# Patient Record
Sex: Female | Born: 1970 | Race: White | Hispanic: No | Marital: Single | Smoking: Former smoker
Health system: Southern US, Community
[De-identification: ages and names within clinical notes are randomized; demographics above are authoritative.]

## PROBLEM LIST (undated history)

## (undated) DIAGNOSIS — E119 Type 2 diabetes mellitus without complications: Secondary | ICD-10-CM

## (undated) HISTORY — PX: HIP SURGERY: SHX245

## (undated) HISTORY — PX: NECK SURGERY: SHX720

---

## 2019-06-28 ENCOUNTER — Other Ambulatory Visit: Payer: Self-pay

## 2019-06-28 ENCOUNTER — Encounter: Payer: Self-pay | Admitting: Family Medicine

## 2019-06-28 ENCOUNTER — Ambulatory Visit: Payer: Self-pay | Attending: Family Medicine | Admitting: Family Medicine

## 2019-06-28 VITALS — BP 110/75 | HR 85 | Temp 97.7°F | Resp 18 | Ht 66.0 in | Wt 224.0 lb

## 2019-06-28 DIAGNOSIS — M79676 Pain in unspecified toe(s): Secondary | ICD-10-CM

## 2019-06-28 DIAGNOSIS — IMO0001 Reserved for inherently not codable concepts without codable children: Secondary | ICD-10-CM

## 2019-06-28 DIAGNOSIS — R638 Other symptoms and signs concerning food and fluid intake: Secondary | ICD-10-CM

## 2019-06-28 DIAGNOSIS — E119 Type 2 diabetes mellitus without complications: Secondary | ICD-10-CM

## 2019-06-28 LAB — POCT GLYCOSYLATED HEMOGLOBIN (HGB A1C): Hemoglobin A1C: 6.7 % — AB (ref 4.0–5.6)

## 2019-06-28 MED ORDER — TRUE METRIX BLOOD GLUCOSE TEST VI STRP: ORAL_STRIP | 3 refills | Status: DC

## 2019-06-28 MED ORDER — AMOXICILLIN 500 MG PO CAPS: 500.0000 mg | ORAL_CAPSULE | Freq: Two times a day (BID) | ORAL | 0 refills | Status: DC

## 2019-06-28 MED ORDER — TRUEPLUS LANCETS 28G MISC: 3 refills | Status: DC

## 2019-06-28 MED ORDER — METFORMIN HCL 500 MG PO TABS: 500.0000 mg | ORAL_TABLET | Freq: Two times a day (BID) | ORAL | 4 refills | Status: DC

## 2019-06-28 MED ORDER — TRUE METRIX METER W/DEVICE KIT: PACK | 0 refills | Status: DC

## 2019-06-28 MED FILL — AMOXICILLIN 500 MG CAPSULE: 500 | 10 days supply | Qty: 20 | Fill #0

## 2019-06-28 NOTE — Progress Notes (Signed)
Subjective:  Patient ID: Monica Mcfarland, female    DOB: 24-Dec-1970  Age: 49 y.o. MRN: [id number]  CC: Establish Care   HPI Monica Mcfarland, 49 year old female who presents to establish care.  She denies any significant past medical issues.  She reports that her main concern at today's visit is that she is having pain in both great toenails right greater than left.  She did start exercising about 3 months ago around the time of the onset of the pain in her toenails.  She also started wearing new shoes which were given to her by someone.  She now states that the pain in her great toenails is so severe at times that she cannot wear shoes because the pressure of the shoe against her nails causes increased pain.  Sometimes nails have no pain but always have pain if something is pressed against the nail.  She has noticed that the nail has been thicker and wider and she has tried trimming away the parts of the nail that appeared discolored.  She has noticed no blood or discolored drainage from the nails but the edges of the toe around the toenails have been red and swollen.  She denies any injury to the toenails.  She is concerned that she may have a fungal infection in her toenails and that she might be diabetic.         She reports that she has a strong family history of diabetes in both parents as well as 2 sisters with diagnosis of diabetes and 1 sister is only 2 years older than the patient.  Over the past 2 months, she feels as if she is constantly thirsty.  She has been trying to make changes in her diet to lose weight.  Because she is constantly thirsty she is drinking more water and therefore urinating more frequently.  She denies any abdominal pain-no nausea/vomiting/diarrhea or constipation.  No discomfort with urination.  She denies any increased back pain.  No numbness or tingling in the hands or feet.  History reviewed. No pertinent past medical history.  Past Surgical History:  Procedure  Laterality Date  . CESAREAN SECTION     3 children     Family History  Problem Relation Age of Onset  . Diabetes Mother   . Heart disease Mother   . Hearing loss Maternal Grandmother   . Hearing loss Maternal Grandfather     Social History   Tobacco Use  . Smoking status: Not on file  . Smokeless tobacco: Never Used  Substance Use Topics  . Alcohol use: Not Currently    ROS Review of Systems  Constitutional: Positive for fatigue. Negative for chills and fever.  HENT: Negative for sore throat and trouble swallowing.   Eyes: Negative for photophobia and visual disturbance.  Respiratory: Negative for cough and shortness of breath.   Cardiovascular: Negative for chest pain and palpitations.  Gastrointestinal: Negative for abdominal pain, blood in stool, constipation and diarrhea.  Endocrine: Positive for polydipsia and polyuria. Negative for polyphagia.  Genitourinary: Positive for frequency. Negative for dysuria and flank pain.  Musculoskeletal: Positive for gait problem. Negative for back pain.       Pain in bilateral great toenails  Skin: Positive for color change (Color change of toenails). Negative for rash and wound.  Neurological: Negative for dizziness and headaches.  Hematological: Negative for adenopathy. Does not bruise/bleed easily.  Psychiatric/Behavioral: Negative for self-injury and suicidal ideas. The patient is nervous/anxious (Anxious that she may be  diabetic).     Objective:   Today's Vitals: BP 110/75 (BP Location: Left Arm, Patient Position: Sitting, Cuff Size: Normal)   Pulse 85   Temp 97.7 F (36.5 C) (Oral)   Resp 18   Ht 5\' 6"  (1.676 m)   Wt 224 lb (101.6 kg)   SpO2 97%   BMI 36.15 kg/m   Physical Exam Vitals reviewed. Nursing note reviewed: not available.  Constitutional:      General: She is not in acute distress.    Appearance: Normal appearance. She is obese.  Cardiovascular:     Rate and Rhythm: Normal rate and regular rhythm.    Pulmonary:     Effort: Pulmonary effort is normal.     Breath sounds: Normal breath sounds.  Abdominal:     Palpations: Abdomen is soft.     Tenderness: There is no abdominal tenderness. There is no right CVA tenderness, left CVA tenderness, guarding or rebound.  Musculoskeletal:        General: No swelling or tenderness.     Right lower leg: No edema.     Left lower leg: No edema.  Skin:    General: Skin is warm and dry.     Comments: Thickening of the ends of the great toenails with whitish discoloration on top and bottom of nail appears black but nails are trimmed in a curved fashion with ingrowth of both nails with erythema and edema of the surrounding tissue.  No visible pus like material/drainage but area is very tender to touch right greater than left  Neurological:     General: No focal deficit present.     Mental Status: She is alert and oriented to person, place, and time.  Psychiatric:        Mood and Affect: Mood normal.        Behavior: Behavior normal.     Assessment & Plan:  1. Pain around toenail She has complaint of pain that has been going on for about 3 months that has gradually worsened and now has pain when she is wearing shoes.  She appears on examination to have some onychomycosis of the great toenails bilaterally as well as some ingrowing of the sides of the nails.  Prescription for amoxicillin 500 mg twice daily x10 days due to possible infection from ingrowing toenails.  Referral placed to podiatry.  Patient is encouraged to apply for [facility] financial discount program to help with the cost of medical care/specialty follow-up. - Ambulatory referral to Podiatry - amoxicillin (AMOXIL) 500 MG capsule; Take 1 capsule (500 mg total) by mouth 2 (two) times daily.  Dispense: 20 capsule; Refill: 0  2. Thirst Patient reports family history of mom, dad and 2 sisters with diabetes and she would like to have testing for diabetes as well.  She reports for the past 2 to 3  months she has had increased thirst which has caused her to have increased daily water intake resulting in frequent urination.  Will check hemoglobin A1c. - HgB A1c  3. New onset type 2 diabetes mellitus (HCC) Patient with hemoglobin A1c of 6.7 consistent with a diagnosis of diabetes.  Patient unfortunately was seen for the last appointment time of the day and lab was closed by the end of patient's visit and pharmacy had been called ahead regarding her antibiotic so that she could pick up this medication before pharmacy closed.  Patient has been asked to return in 1 to 2 weeks to follow-up with myself or the  clinical pharmacist regarding new onset of type 2 diabetes.  Patient additionally will be contacted by phone and asked to pick up a prescription from this pharmacy for Metformin that she can start to help with insulin resistance and will also be prescribed diabetic testing supplies. -Phone number listed for the patient was called and message was left for patient regarding availability of a new prescription at [facility] that she may pick up tomorrow  An After Visit Summary was printed and given to the patient.   Follow-up: Return in about 2 weeks (around 07/12/2019) for 1-2 week f/u with myself/Dr. Chapman Fitch if possible.    Antony Blackbird MD

## 2019-06-29 MED FILL — TRUE METRIX TEST STRIP: 90 days supply | Qty: 100 | Fill #0

## 2019-06-29 MED FILL — TRUEplus LANCETS 28G MISC: 90 days supply | Qty: 100 | Fill #0

## 2019-06-29 MED FILL — metFORMIN HCL 500 MG TABS: 500 | 30 days supply | Qty: 60 | Fill #0

## 2019-06-29 MED FILL — !TRUE METRIX BLOOD GLUCOSE: 30 days supply | Qty: 1 | Fill #0

## 2019-07-12 ENCOUNTER — Other Ambulatory Visit: Payer: Self-pay

## 2019-07-12 ENCOUNTER — Ambulatory Visit: Payer: Self-pay | Attending: Family Medicine | Admitting: Family Medicine

## 2019-07-12 ENCOUNTER — Encounter: Payer: Self-pay | Admitting: Family Medicine

## 2019-07-12 VITALS — BP 116/79 | HR 84 | Temp 97.2°F | Resp 16 | Wt 222.4 lb

## 2019-07-12 DIAGNOSIS — M79676 Pain in unspecified toe(s): Secondary | ICD-10-CM

## 2019-07-12 DIAGNOSIS — E119 Type 2 diabetes mellitus without complications: Secondary | ICD-10-CM

## 2019-07-12 LAB — GLUCOSE, POCT (MANUAL RESULT ENTRY): POC Glucose: 173 mg/dL — AB (ref 70–99)

## 2019-07-12 MED ORDER — TRUE METRIX BLOOD GLUCOSE TEST VI STRP: ORAL_STRIP | 3 refills | Status: AC

## 2019-07-12 MED ORDER — TRUE METRIX METER W/DEVICE KIT: PACK | 0 refills | Status: AC

## 2019-07-12 MED ORDER — IBUPROFEN 600 MG PO TABS: 600.0000 mg | ORAL_TABLET | Freq: Three times a day (TID) | ORAL | 0 refills | Status: DC | PRN

## 2019-07-12 MED ORDER — METFORMIN HCL 500 MG PO TABS: 500.0000 mg | ORAL_TABLET | Freq: Two times a day (BID) | ORAL | 4 refills | Status: DC

## 2019-07-12 MED ORDER — TRUEPLUS LANCETS 28G MISC: 3 refills | Status: AC

## 2019-07-12 NOTE — Progress Notes (Signed)
Subjective:  Patient ID: Monica Mcfarland, female    DOB: 19-Feb-1971  Age: 49 y.o. MRN: [id number]  CC: No chief complaint on file. Follow-up with painful toenails and abnormal hemoglobin A1c at last visit-    HPI Monica Mcfarland, 49 year old female, who is status post recent visit to establish care and patient at that time with complaint of bilateral toenail pain and patient with concern that she might be diabetic due to a strong family history of diabetes as well as increased thirst, frequent urination and possible fungal nail infection.  At her last visit, she denied any past medical issues but today she states that she continues to have bilateral toenail pain however she is not allowed to have narcotic pain medication as she is currently in a drug and alcohol treatment program.  She does continue to have bilateral toenail pain especially when there is any pressure against her nails such as wearing socks or shoes.  She had mild improvement in nail discomfort after antibiotic therapy at her last visit.  She reports that she was not aware of her lab results from her last visit and continues to have increased thirst and urinary frequency.  She is not surprised that lab work revealing a diagnosis of diabetes due to her strong family history as well as her current symptoms.  Her current toenail pain is about a 7-8 on a 0-to-10 scale whenever she is wearing shoes/walking.    Past Surgical History:  Procedure Laterality Date  . CESAREAN SECTION     3 children     Family History  Problem Relation Age of Onset  . Diabetes Mother   . Heart disease Mother   . Diabetes Father   . Hearing loss Maternal Grandmother   . Hearing loss Maternal Grandfather   . Diabetes Sister     Social History   Tobacco Use  . Smoking status: Former Smoker    Types: Cigarettes    Start date: 07/12/2015  . Smokeless tobacco: Never Used  Substance Use Topics  . Alcohol use: Not Currently    ROS Review of  Systems  Constitutional: Positive for fatigue. Negative for chills and fever.  HENT: Negative for sore throat and trouble swallowing.   Eyes: Negative for photophobia and visual disturbance.  Respiratory: Negative for cough and shortness of breath.   Cardiovascular: Negative for chest pain and palpitations.  Gastrointestinal: Negative for abdominal pain, blood in stool, constipation, diarrhea and nausea.  Endocrine: Positive for polydipsia, polyphagia and polyuria.  Genitourinary: Positive for frequency. Negative for dysuria.  Musculoskeletal: Positive for gait problem. Negative for arthralgias and back pain.  Skin: Negative for rash and wound.  Neurological: Negative for dizziness and headaches.  Hematological: Negative for adenopathy. Does not bruise/bleed easily.  Psychiatric/Behavioral: Negative for self-injury and suicidal ideas.    Objective:   Today's Vitals: BP 116/79 (BP Location: Left Arm, Patient Position: Sitting, Cuff Size: Large)   Pulse 84   Temp (!) 97.2 F (36.2 C)   Resp 16   Wt 222 lb 6.4 oz (100.9 kg)   SpO2 97%   BMI 35.90 kg/m   Physical Exam Vitals and nursing note reviewed.  Constitutional:      General: She is not in acute distress.    Appearance: Normal appearance. She is obese. She is not ill-appearing.  Cardiovascular:     Rate and Rhythm: Normal rate and regular rhythm.  Pulmonary:     Effort: Pulmonary effort is normal.     Breath  sounds: Normal breath sounds.  Abdominal:     Palpations: Abdomen is soft.     Tenderness: There is no abdominal tenderness. There is no right CVA tenderness, left CVA tenderness, guarding or rebound.  Musculoskeletal:        General: No tenderness.     Right lower leg: No edema.     Left lower leg: No edema.  Skin:    Comments: Patient with ingrowing toenails, great toenails are misshapened with some areas of discoloration.  Tenderness and mild edema/erythema at the toenail borders which is decreased from last  visit.  No visible pus/signs of infection and no increased warmth  Neurological:     General: No focal deficit present.     Mental Status: She is alert.  Psychiatric:        Mood and Affect: Mood normal.        Behavior: Behavior normal.     Assessment & Plan:   1. New onset type 2 diabetes mellitus (HCC)  Postprandial glucose of 173 and the patient's last visit, her hemoglobin A1c was 6.7 consistent with prediabetes.  Patient reports that she did not receive notification regarding her hemoglobin A1c or that prescriptions have been sent to [facility] for Metformin and diabetic testing supplies.  Patient would like prescription sent to [facility] which was done at today's visit.  Discussed low carbohydrate diet, no concentrated sweets.  Patient will have comprehensive metabolic panel at today's visit in follow-up of newly diabetes. - Glucose (CBG)  -Comprehensive metabolic panel  2.  Pain around toenails Prescription provided for ibuprofen to help with nail pain while podiatry referral is pending.  Outpatient Encounter Medications as of 07/12/2019  Medication Sig  . amoxicillin (AMOXIL) 500 MG capsule Take 1 capsule (500 mg total) by mouth 2 (two) times daily.  . Blood Glucose Monitoring Suppl (TRUE METRIX METER) w/Device KIT Use to check blood sugars once per day either fasting/2 hours after a meal or before bedtime  . glucose blood (TRUE METRIX BLOOD GLUCOSE TEST) test strip Use as instructed to check blood sugar once per day  . metFORMIN (GLUCOPHAGE) 500 MG tablet Take 1 tablet (500 mg total) by mouth 2 (two) times daily with a meal. Start with one daily after evening meal for one week then increase to twice per day (Patient not taking: Reported on 07/12/2019)  . TRUEplus Lancets 28G MISC Use once daily when checking blood sugars   No facility-administered encounter medications on file as of 07/12/2019.    An After Visit Summary was printed and given to the  patient.   Follow-up: Return in about 3 months (around 10/12/2019) for chronic issues; 2-3 week f/u with [facility] regarding DM.   Antony Blackbird MD

## 2019-07-12 NOTE — Patient Instructions (Addendum)
Diabetes Basics  Diabetes (diabetes mellitus) is a long-term (chronic) disease. It occurs when the body does not properly use sugar (glucose) that is released from food after you eat. Diabetes may be caused by one or both of these problems:  Your pancreas does not make enough of a hormone called insulin.  Your body does not react in a normal way to insulin that it makes. Insulin lets sugars (glucose) go into cells in your body. This gives you energy. If you have diabetes, sugars cannot get into cells. This causes high blood sugar (hyperglycemia). Follow these instructions at home: How is diabetes treated? You may need to take insulin or other diabetes medicines daily to keep your blood sugar in balance. Take your diabetes medicines every day as told by your doctor. List your diabetes medicines here: Diabetes medicines  Name of medicine: ______________________________ ? Amount (dose): _______________ Time (a.m./p.m.): _______________ Notes: ___________________________________  Name of medicine: ______________________________ ? Amount (dose): _______________ Time (a.m./p.m.): _______________ Notes: ___________________________________  Name of medicine: ______________________________ ? Amount (dose): _______________ Time (a.m./p.m.): _______________ Notes: ___________________________________ If you use insulin, you will learn how to give yourself insulin by injection. You may need to adjust the amount based on the food that you eat. List the types of insulin you use here: Insulin  Insulin type: ______________________________ ? Amount (dose): _______________ Time (a.m./p.m.): _______________ Notes: ___________________________________  Insulin type: ______________________________ ? Amount (dose): _______________ Time (a.m./p.m.): _______________ Notes: ___________________________________  Insulin type: ______________________________ ? Amount (dose): _______________ Time (a.m./p.m.):  _______________ Notes: ___________________________________  Insulin type: ______________________________ ? Amount (dose): _______________ Time (a.m./p.m.): _______________ Notes: ___________________________________  Insulin type: ______________________________ ? Amount (dose): _______________ Time (a.m./p.m.): _______________ Notes: ___________________________________ How do I manage my blood sugar?  Check your blood sugar levels using a blood glucose monitor as directed by your doctor. Your doctor will set treatment goals for you. Generally, you should have these blood sugar levels:  Before meals (preprandial): 80-130 mg/dL (4.4-7.2 mmol/L).  After meals (postprandial): below 180 mg/dL (10 mmol/L).  A1c level: less than 7%. Write down the times that you will check your blood sugar levels: Blood sugar checks  Time: _______________ Notes: ___________________________________  Time: _______________ Notes: ___________________________________  Time: _______________ Notes: ___________________________________  Time: _______________ Notes: ___________________________________  Time: _______________ Notes: ___________________________________  Time: _______________ Notes: ___________________________________  What do I need to know about low blood sugar? Low blood sugar is called hypoglycemia. This is when blood sugar is at or below 70 mg/dL (3.9 mmol/L). Symptoms may include:  Feeling: ? Hungry. ? Worried or nervous (anxious). ? Sweaty and clammy. ? Confused. ? Dizzy. ? Sleepy. ? Sick to your stomach (nauseous).  Having: ? A fast heartbeat. ? A headache. ? A change in your vision. ? Tingling or no feeling (numbness) around the mouth, lips, or tongue. ? Jerky movements that you cannot control (seizure).  Having trouble with: ? Moving (coordination). ? Sleeping. ? Passing out (fainting). ? Getting upset easily (irritability). Treating low blood sugar To treat low blood  sugar, eat or drink something sugary right away. If you can think clearly and swallow safely, follow the 15:15 rule:  Take 15 grams of a fast-acting carb (carbohydrate). Talk with your doctor about how much you should take.  Some fast-acting carbs are: ? Sugar tablets (glucose pills). Take 3-4 glucose pills. ? 6-8 pieces of hard candy. ? 4-6 oz (120-150 mL) of fruit juice. ? 4-6 oz (120-150 mL) of regular (not diet) soda. ? 1 Tbsp (15 mL) honey or sugar.    Check your blood sugar 15 minutes after you take the carb.  If your blood sugar is still at or below 70 mg/dL (3.9 mmol/L), take 15 grams of a carb again.  If your blood sugar does not go above 70 mg/dL (3.9 mmol/L) after 3 tries, get help right away.  After your blood sugar goes back to normal, eat a meal or a snack within 1 hour. Treating very low blood sugar If your blood sugar is at or below 54 mg/dL (3 mmol/L), you have very low blood sugar (severe hypoglycemia). This is an emergency. Do not wait to see if the symptoms will go away. Get medical help right away. Call your local emergency services (911 in the [place] Do not drive yourself to the hospital. Questions to ask your health care provider  Do I need to meet with a diabetes educator?  What equipment will I need to care for myself at home?  What diabetes medicines do I need? When should I take them?  How often do I need to check my blood sugar?  What number can I call if I have questions?  When is my next doctor's visit?  Where can I find a support group for people with diabetes? Where to find more information  American Diabetes Association: www.diabetes.org  American Association of Diabetes Educators: www.diabeteseducator.org/patient-resources Contact a doctor if:  Your blood sugar is at or above 240 mg/dL (13.3 mmol/L) for 2 days in a row.  You have been sick or have had a fever for 2 days or more, and you are not getting better.  You have any of these  problems for more than 6 hours: ? You cannot eat or drink. ? You feel sick to your stomach (nauseous). ? You throw up (vomit). ? You have watery poop (diarrhea). Get help right away if:  Your blood sugar is lower than 54 mg/dL (3 mmol/L).  You get confused.  You have trouble: ? Thinking clearly. ? Breathing. Summary  Diabetes (diabetes mellitus) is a long-term (chronic) disease. It occurs when the body does not properly use sugar (glucose) that is released from food after digestion.  Take insulin and diabetes medicines as told.  Check your blood sugar every day, as often as told.  Keep all follow-up visits as told by your doctor. This is important. This information is not intended to replace advice given to you by your health care provider. Make sure you discuss any questions you have with your health care provider. Document Revised: 01/10/2019 Document Reviewed: 07/22/2017 Elsevier Patient Education  2020 [employer]  Carbohydrate Counting for Diabetes Mellitus, Adult  Carbohydrate counting is a method of keeping track of how many carbohydrates you eat. Eating carbohydrates naturally increases the amount of sugar (glucose) in the blood. Counting how many carbohydrates you eat helps keep your blood glucose within normal limits, which helps you manage your diabetes (diabetes mellitus). It is important to know how many carbohydrates you can safely have in each meal. This is different for every person. A diet and nutrition specialist (registered dietitian) can help you make a meal plan and calculate how many carbohydrates you should have at each meal and snack. Carbohydrates are found in the following foods:  Grains, such as breads and cereals.  Dried beans and soy products.  Starchy vegetables, such as potatoes, peas, and corn.  Fruit and fruit juices.  Milk and yogurt.  Sweets and snack foods, such as cake, cookies, candy, chips, and soft drinks. How do I   count  carbohydrates? There are two ways to count carbohydrates in food. You can use either of the methods or a combination of both. Reading "Nutrition Facts" on packaged food The "Nutrition Facts" list is included on the labels of almost all packaged foods and beverages in the [place] It includes:  The serving size.  Information about nutrients in each serving, including the grams (g) of carbohydrate per serving. To use the "Nutrition Facts":  Decide how many servings you will have.  Multiply the number of servings by the number of carbohydrates per serving.  The resulting number is the total amount of carbohydrates that you will be having. Learning standard serving sizes of other foods When you eat carbohydrate foods that are not packaged or do not include "Nutrition Facts" on the label, you need to measure the servings in order to count the amount of carbohydrates:  Measure the foods that you will eat with a food scale or measuring cup, if needed.  Decide how many standard-size servings you will eat.  Multiply the number of servings by 15. Most carbohydrate-rich foods have about 15 g of carbohydrates per serving. ? For example, if you eat 8 oz (170 g) of strawberries, you will have eaten 2 servings and 30 g of carbohydrates (2 servings x 15 g = 30 g).  For foods that have more than one food mixed, such as soups and casseroles, you must count the carbohydrates in each food that is included. The following list contains standard serving sizes of common carbohydrate-rich foods. Each of these servings has about 15 g of carbohydrates:   hamburger bun or  English muffin.   oz (15 mL) syrup.   oz (14 g) jelly.  1 slice of bread.  1 six-inch tortilla.  3 oz (85 g) cooked rice or pasta.  4 oz (113 g) cooked dried beans.  4 oz (113 g) starchy vegetable, such as peas, corn, or potatoes.  4 oz (113 g) hot cereal.  4 oz (113 g) mashed potatoes or  of a large baked potato.  4 oz (113  g) canned or frozen fruit.  4 oz (120 mL) fruit juice.  4-6 crackers.  6 chicken nuggets.  6 oz (170 g) unsweetened dry cereal.  6 oz (170 g) plain fat-free yogurt or yogurt sweetened with artificial sweeteners.  8 oz (240 mL) milk.  8 oz (170 g) fresh fruit or one small piece of fruit.  24 oz (680 g) popped popcorn. Example of carbohydrate counting Sample meal  3 oz (85 g) chicken breast.  6 oz (170 g) brown rice.  4 oz (113 g) corn.  8 oz (240 mL) milk.  8 oz (170 g) strawberries with sugar-free whipped topping. Carbohydrate calculation 1. Identify the foods that contain carbohydrates: ? Rice. ? Corn. ? Milk. ? Strawberries. 2. Calculate how many servings you have of each food: ? 2 servings rice. ? 1 serving corn. ? 1 serving milk. ? 1 serving strawberries. 3. Multiply each number of servings by 15 g: ? 2 servings rice x 15 g = 30 g. ? 1 serving corn x 15 g = 15 g. ? 1 serving milk x 15 g = 15 g. ? 1 serving strawberries x 15 g = 15 g. 4. Add together all of the amounts to find the total grams of carbohydrates eaten: ? 30 g + 15 g + 15 g + 15 g = 75 g of carbohydrates total. Summary  Carbohydrate counting is a method of   keeping track of how many carbohydrates you eat.  Eating carbohydrates naturally increases the amount of sugar (glucose) in the blood.  Counting how many carbohydrates you eat helps keep your blood glucose within normal limits, which helps you manage your diabetes.  A diet and nutrition specialist (registered dietitian) can help you make a meal plan and calculate how many carbohydrates you should have at each meal and snack. This information is not intended to replace advice given to you by your health care provider. Make sure you discuss any questions you have with your health care provider. Document Revised: 11/11/2016 Document Reviewed: 10/01/2015 Elsevier Patient Education  2020 Elsevier Inc.  

## 2019-07-12 NOTE — Progress Notes (Signed)
States she was not aware of last A1C number. Has not taken medication, Metformin  Bilateral foot pain 7/10.

## 2019-07-13 LAB — COMPREHENSIVE METABOLIC PANEL WITH GFR
ALT: 15 IU/L (ref 0–32)
AST: 16 IU/L (ref 0–40)
Albumin/Globulin Ratio: 1.6 (ref 1.2–2.2)
Albumin: 4.6 g/dL (ref 3.8–4.8)
Alkaline Phosphatase: 113 IU/L (ref 39–117)
BUN/Creatinine Ratio: 18 (ref 9–23)
BUN: 14 mg/dL (ref 6–24)
Bilirubin Total: 0.5 mg/dL (ref 0.0–1.2)
CO2: 25 mmol/L (ref 20–29)
Calcium: 9.5 mg/dL (ref 8.7–10.2)
Chloride: 104 mmol/L (ref 96–106)
Creatinine, Ser: 0.79 mg/dL (ref 0.57–1.00)
GFR calc Af Amer: 102 mL/min/1.73
GFR calc non Af Amer: 88 mL/min/1.73
Globulin, Total: 2.9 g/dL (ref 1.5–4.5)
Glucose: 162 mg/dL — ABNORMAL HIGH (ref 65–99)
Potassium: 4 mmol/L (ref 3.5–5.2)
Sodium: 144 mmol/L (ref 134–144)
Total Protein: 7.5 g/dL (ref 6.0–8.5)

## 2019-07-15 DIAGNOSIS — E119 Type 2 diabetes mellitus without complications: Secondary | ICD-10-CM | POA: Insufficient documentation

## 2019-07-15 DIAGNOSIS — E669 Obesity, unspecified: Secondary | ICD-10-CM | POA: Insufficient documentation

## 2019-07-15 DIAGNOSIS — E1169 Type 2 diabetes mellitus with other specified complication: Secondary | ICD-10-CM | POA: Insufficient documentation

## 2019-08-06 ENCOUNTER — Other Ambulatory Visit: Payer: Self-pay

## 2019-08-06 ENCOUNTER — Ambulatory Visit: Payer: Self-pay | Attending: Family Medicine | Admitting: Pharmacist

## 2019-08-06 DIAGNOSIS — E119 Type 2 diabetes mellitus without complications: Secondary | ICD-10-CM

## 2019-08-06 MED ORDER — METFORMIN HCL ER 500 MG PO TB24: ORAL_TABLET | ORAL | 2 refills | Status: DC

## 2019-08-06 MED ORDER — ROSUVASTATIN CALCIUM 10 MG PO TABS: 10.0000 mg | ORAL_TABLET | Freq: Every day | ORAL | 0 refills | Status: DC

## 2019-08-06 NOTE — Progress Notes (Signed)
    S:    PCP: Dr. Jillyn Hidden  No chief complaint on file.  Patient arrives in good spirits. Presents for diabetes evaluation, education, and management Patient was referred and last seen by Primary Care Provider on 07/12/2019.   Patient reports Diabetes was diagnosed in March 2021.  Family/Social History:  - FHx: DM, heart disease  - Tobacco: former smoker - Alcohol: denies use   Insurance coverage/medication affordability: self pay  Patient reports adherence with medications.  Current diabetes medications include: metformin 500 mg BID Current hypertension medications include: none  Current hyperlipidemia medications include: none   Patient denies hypoglycemic events.  Patient reported dietary habits: - Dietary counseling given   Patient-reported exercise habits: limited; p lans to increase. "I'm working on it"   Patient reports nocturia (nighttime urination).  Patient denies neuropathy (nerve pain). Patient denies visual changes. Patient reports self foot exams.     O:   Lab Results  Component Value Date   HGBA1C 6.7 (A) 06/28/2019   There were no vitals filed for this visit.  Lipid Panel  No results found for: CHOL, TRIG, HDL, CHOLHDL, VLDL, LDLCALC, LDLDIRECT  Home fasting blood sugars:  Not checking currently  2 hour post-meal/random blood sugars: 85 - 140.   Clinical Atherosclerotic Cardiovascular Disease (ASCVD): No  The ASCVD Risk score Denman George DC Jr., et al., 2013) failed to calculate for the following reasons:   Cannot find a previous HDL lab   Cannot find a previous total cholesterol lab   A/P: Diabetes newly dx currently controlled. Patient is able to verbalize appropriate hypoglycemia management plan. Patient is adherent with medication but endorses diarrhea since starting metformin. Will change to XR. Home CBGs appear well-controlled.  -Discontinued metformin. -Start metformin 500 mg XR; take 1 tablet BID for 1 week. If tolerable after 1 week, increase  to two tablets BID.  -Extensively discussed pathophysiology of diabetes, recommended lifestyle interventions, dietary effects on blood sugar control -Counseled on s/sx of and management of hypoglycemia -Next A1C anticipated 12/2019.   ASCVD risk - primary prevention in patient with diabetes. Need lipid panel to accurately estimate ASCVD risk score. I recommend at least moderate intensity statin indicated. -Started rosuvastatin 10 mg.  -Lipid  HM: Pneumovax recommended. Will address at follow-up.   Written patient instructions provided.  Total time in face to face counseling 15 minutes.   Follow up Pharmacist Clinic Visit in 1 month.   Butch Penny, PharmD, CPP Clinical Pharmacist [facility] [phone]

## 2019-08-06 NOTE — Patient Instructions (Signed)
Thank you for coming to see me today. Please do the following:  1. Stop metformin.  2. Start extended release metformin. Take 1 tablet twice a day with a meal. After 1 week, increase to 2 tablets twice a day with a meal if tolerable.  3. Start taking rosuvastatin 10mg . Take 1 tablet in the evening.  4. Continue checking blood sugars at home. Check fasting and after meals.  5. Continue making the lifestyle changes we've discussed together during our visit. Diet and exercise play a significant role in improving your blood sugars.  6. Follow-up with me in 1 month.    Hypoglycemia or low blood sugar:   Low blood sugar can happen quickly and may become an emergency if not treated right away.   While this shouldn't happen often, it can be brought upon if you skip a meal or do not eat enough. Also, if your insulin or other diabetes medications are dosed too high, this can cause your blood sugar to go to low.   Warning signs of low blood sugar include: 1. Feeling shaky or dizzy 2. Feeling weak or tired  3. Excessive hunger 4. Feeling anxious or upset  5. Sweating even when you aren't exercising  What to do if I experience low blood sugar? 1. Check your blood sugar with your meter. If lower than 70, proceed to step 2.  2. Treat with 3-4 glucose tablets or 3 packets of regular sugar. If these aren't around, you can try hard candy. Yet another option would be to drink 4 ounces of fruit juice or 6 ounces of REGULAR soda.  3. Re-check your sugar in 15 minutes. If it is still below 70, do what you did in step 2 again. If has come back up, go ahead and eat a snack or small meal at this time.

## 2019-08-07 LAB — LIPID PANEL
Chol/HDL Ratio: 2.4 ratio (ref 0.0–4.4)
Cholesterol, Total: 127 mg/dL (ref 100–199)
HDL: 54 mg/dL
LDL Chol Calc (NIH): 59 mg/dL (ref 0–99)
Triglycerides: 71 mg/dL (ref 0–149)
VLDL Cholesterol Cal: 14 mg/dL (ref 5–40)

## 2019-08-08 ENCOUNTER — Telehealth: Payer: Self-pay | Admitting: *Deleted

## 2019-08-08 NOTE — Telephone Encounter (Signed)
Metformin very commonly causes diarrhea when a patient first starts taking it. It typically resolves within the first week or so. I would recommend she take OTC Imodium if needed while her body adjusts to the medication.   Marcy Siren, D.O. Primary Care at [facility]  08/08/2019, 4:02 PM

## 2019-08-08 NOTE — Telephone Encounter (Signed)
Informed patient with what provider stated and she verbalized understanding.  

## 2019-08-08 NOTE — Telephone Encounter (Signed)
Spoke with patient and she thinks the Metformin is causing her to get diarrhea. Per pt she is wanting to know if she could get something to help her with her diarrhea.

## 2019-09-05 ENCOUNTER — Ambulatory Visit: Payer: Self-pay | Attending: Family Medicine | Admitting: Pharmacist

## 2019-09-05 ENCOUNTER — Encounter: Payer: Self-pay | Admitting: Pharmacist

## 2019-09-05 ENCOUNTER — Other Ambulatory Visit: Payer: Self-pay

## 2019-09-05 DIAGNOSIS — Z23 Encounter for immunization: Secondary | ICD-10-CM

## 2019-09-05 DIAGNOSIS — E119 Type 2 diabetes mellitus without complications: Secondary | ICD-10-CM

## 2019-09-05 NOTE — Progress Notes (Signed)
    S:    PCP: Dr. Jillyn Hidden  No chief complaint on file.  Patient arrives in good spirits. Presents for diabetes evaluation, education, and management Patient was referred and last seen by Primary Care Provider on 07/12/2019.   Patient reports Diabetes was diagnosed in March 2021.  Family/Social History:  - FHx: DM, heart disease  - Tobacco: former smoker - Alcohol: denies use   Insurance coverage/medication affordability: self pay  Patient reports adherence with medications.  Current diabetes medications include: metformin 500 mg BID Current hypertension medications include: none  Current hyperlipidemia medications include: none   Patient denies hypoglycemic events.  Patient reported dietary habits: - Dietary counseling given   Patient-reported exercise habits: limited; p lans to increase. "I'm working on it"   Patient reports nocturia (nighttime urination).  Patient denies neuropathy (nerve pain). Patient denies visual changes. Patient reports self foot exams.     O:   Lab Results  Component Value Date   HGBA1C 6.7 (A) 06/28/2019   There were no vitals filed for this visit.  Lipid Panel     Component Value Date/Time   CHOL 127 08/06/2019 0915   TRIG 71 08/06/2019 0915   HDL 54 08/06/2019 0915   CHOLHDL 2.4 08/06/2019 0915   LDLCALC 59 08/06/2019 0915    Home fasting blood sugars:  Not checking currently  2 hour post-meal/random blood sugars: 85 - 140.   Clinical Atherosclerotic Cardiovascular Disease (ASCVD): No  The ASCVD Risk score Denman George DC Jr., et al., 2013) failed to calculate for the following reasons:   The valid total cholesterol range is 130 to 320 mg/dL   A/P: Diabetes newly dx currently controlled. Patient is able to verbalize appropriate hypoglycemia management plan. Patient is adherent with medication and reports resolution of diarrhea since startin XR form of metformin.   -Continue current regimen.  -Extensively discussed pathophysiology of  diabetes, recommended lifestyle interventions, dietary effects on blood sugar control -Counseled on s/sx of and management of hypoglycemia -Next A1C anticipated 12/2019.   ASCVD risk - primary prevention in patient with diabetes. LDL controlled at 59. Pt is tolerating rosuvastatin.  -Continue rosuvastatin 10 mg daily.   HM: Pneumovax given.  Written patient instructions provided.  Total time in face to face counseling 15 minutes.   Follow up Pharmacist Clinic Visit in 1 month.   Butch Penny, PharmD, CPP Clinical Pharmacist [facility] [phone]

## 2019-10-12 ENCOUNTER — Ambulatory Visit: Payer: Self-pay | Admitting: Family Medicine

## 2019-10-31 ENCOUNTER — Other Ambulatory Visit: Payer: Self-pay | Admitting: Family Medicine

## 2019-10-31 DIAGNOSIS — E119 Type 2 diabetes mellitus without complications: Secondary | ICD-10-CM

## 2019-10-31 NOTE — Progress Notes (Signed)
Patient ID: Monica Mcfarland, female    DOB: 03/31/1971  MRN: [id number]  CC: Diabetes Follow-Up  Subjective: Monica Mcfarland is a 49 y.o. female with history of type 2 diabetes mellitus who presents for diabetes follow-up.  1. DIABETES TYPE 2 FOLLOW-UP: Last A1C: 5.9% on 11/02/2019 Are you fasting today: [x]  Yes, egg and cheese wrap, water Have you taken your anti-diabetic medications today: [x]  Yes []  No  Med Adherence:  [x]  Yes    []  No Medication side effects:  []  Yes    [x]  No Home Monitoring?  [x]  Yes    []  No Home glucose results range:   Results for last 7 days morning and evening: 99, 127 103, 88 91, 128 109, 97 101, 109 87,107 91, 112  Diet Adherence: [x]  Yes    []  No Exercise: []  Yes    [x]  No Hypoglycemic episodes?: []  Yes    [x]  No Numbness of the feet? [x]  Yes    []  No Retinopathy hx? []  Yes    [x]  No Last eye exam: 6 months ago, wears prescription glasses, feels like she can see better without them Comments: Last visit last visit 07/12/2019 with Dr. Chapman Fitch.  During that encounter Metformin continued.  CBG and CMP obtained.  2. CHOLESTEROL FOLLOW-UP: Last Lipid Panel results:  HDL  Date Value Ref Range Status  08/06/2019 54 >39 mg/dL Final   Triglycerides  Date Value Ref Range Status  08/06/2019 71 0 - 149 mg/dL Final    Are you fasting today: []  Yes [x]  No Med Adherence: [x]  Yes    []  No Medication side effects: []  Yes    [x]  No Muscle aches:  []  Yes    [x]  No Diet Adherence: []  Yes    [x]  No    Patient Active Problem List   Diagnosis Date Noted  . Type 2 diabetes mellitus ([place]) 07/15/2019     Current Outpatient Medications on File Prior to Visit  Medication Sig Dispense Refill  . amoxicillin (AMOXIL) 500 MG capsule Take 1 capsule (500 mg total) by mouth 2 (two) times daily. 20 capsule 0  . Blood Glucose Monitoring Suppl (TRUE METRIX METER) w/Device KIT Use to check blood sugars once per day either fasting/2 hours after a meal or before  bedtime 1 kit 0  . glucose blood (TRUE METRIX BLOOD GLUCOSE TEST) test strip Use as instructed to check blood sugar once per day 100 each 3  . ibuprofen (ADVIL) 600 MG tablet Take 1 tablet (600 mg total) by mouth every 8 (eight) hours as needed. For pain; eat before taking medication 60 tablet 0  . metFORMIN (GLUCOPHAGE-XR) 500 MG 24 hr tablet TAKE 1 TABLET BY MOUTH TWICE DAILY WITH A MEAL FOR 1 WEEK. INCREASE TO 2 TABLETS 2 TIMES DAILY IF TOLERABLE 180 tablet 1  . rosuvastatin (CRESTOR) 10 MG tablet TAKE 1 TABLET(10 MG) BY MOUTH DAILY 90 tablet 1  . TRUEplus Lancets 28G MISC Use once daily when checking blood sugars 100 each 3   No current facility-administered medications on file prior to visit.    Allergies  Allergen Reactions  . Shellfish Allergy Anaphylaxis    Social History   Socioeconomic History  . Marital status: Single    Spouse name: Not on file  . Number of children: Not on file  . Years of education: Not on file  . Highest education level: Not on file  Occupational History  . Not on file  Tobacco Use  . Smoking status:  Former Smoker    Types: Cigarettes    Start date: 07/12/2015  . Smokeless tobacco: Never Used  Substance and Sexual Activity  . Alcohol use: Not Currently  . Drug use: Not Currently  . Sexual activity: Not Currently  Other Topics Concern  . Not on file  Social History Narrative  . Not on file   Social Determinants of Health   Financial Resource Strain:   . Difficulty of Paying Living Expenses:   Food Insecurity:   . Worried About Charity fundraiser in the Last Year:   . Arboriculturist in the Last Year:   Transportation Needs:   . Film/video editor (Medical):   Marland Kitchen Lack of Transportation (Non-Medical):   Physical Activity:   . Days of Exercise per Week:   . Minutes of Exercise per Session:   Stress:   . Feeling of Stress :   Social Connections:   . Frequency of Communication with Friends and Family:   . Frequency of Social  Gatherings with Friends and Family:   . Attends Religious Services:   . Active Member of Clubs or Organizations:   . Attends Archivist Meetings:   Marland Kitchen Marital Status:   Intimate Partner Violence:   . Fear of Current or Ex-Partner:   . Emotionally Abused:   Marland Kitchen Physically Abused:   . Sexually Abused:     Family History  Problem Relation Age of Onset  . Diabetes Mother   . Heart disease Mother   . Diabetes Father   . Hearing loss Maternal Grandmother   . Hearing loss Maternal Grandfather   . Diabetes Sister     Past Surgical History:  Procedure Laterality Date  . CESAREAN SECTION     3 children     ROS: Review of Systems Negative except as stated above  PHYSICAL EXAM: Vitals with BMI 11/02/2019 07/12/2019 06/28/2019  Height 5' 6"  - 5' 6"   Weight 202 lbs 10 oz 222 lbs 6 oz 224 lbs  BMI 32.72 00.92 33.00  Systolic 98 762 263  Diastolic 65 79 75  Pulse 65 84 85  SpO2- 95%, room air  Physical Exam  General appearance - alert, well appearing, and in no distress and oriented to person, place, and time Mental status - alert, oriented to person, place, and time, normal mood, behavior, speech, dress, motor activity, and thought processes Neck - supple, no significant adenopathy Lymphatics - no palpable lymphadenopathy, no hepatosplenomegaly Chest - clear to auscultation, no wheezes, rales or rhonchi, symmetric air entry, no tachypnea, retractions or cyanosis Heart - normal rate, regular rhythm, normal S1, S2, no murmurs, rubs, clicks or gallops Neurological - alert, oriented, normal speech, no focal findings or movement disorder noted, cranial nerves II through XII intact, DTR's normal and symmetric, motor and sensory grossly normal bilaterally, normal muscle tone, no tremors, strength 5/5, Romberg sign negative, normal gait and station Musculoskeletal - no joint tenderness, deformity or swelling, no muscular tenderness noted, full range of motion without pain Extremities  - peripheral pulses normal, no pedal edema, no clubbing or cyanosis, no pedal edema noted,  peripheral pulses normal, no pedal edema, no clubbing or cyanosis, feet normal, good pulses, normal color, temperature and sensation, monofilament sensory exam is normal in both feet  ASSESSMENT AND PLAN: 1. New onset type 2 diabetes mellitus ([place]): -Diabetes controlled. -A1C 5.9% improved from prior 6.7%. -CBG higher than expected. Patient not fasting during today's visit.  -Continue Metformin and Rosuvastatin for diabetes  management.  -Adding Gabapentin for bilateral feet numbness.  -CMP last obtained 07/12/2019. -Lipid panel last obtained 08/06/2019. -To achieve an A1C goal of less than or equal to 7.0 percent, a fasting blood sugar of 80 to 130 mg/dL and a postprandial glucose (90 to 120 minutes after a meal) less than 180 mg/dL. In the event of sugars less than 60 mg/dl or greater than 400 mg/dl please notify the clinic ASAP. It is recommended that you undergo annual eye exams and annual foot exams. -Discussed the importance of healthy eating habits, low-carbohydrate diet, low-sugar diet, regular aerobic exercise (at least 150 minutes a week as tolerated) and medication compliance to achieve or maintain control of diabetes. -Follow-up with primary physician in 4 months or sooner if needed. - Glucose (CBG) - POCT glycosylated hemoglobin (Hb A1C) - gabapentin (NEURONTIN) 300 MG capsule; Take 1 capsule (300 mg total) by mouth at bedtime.  Dispense: 30 capsule; Refill: 3 - metFORMIN (GLUCOPHAGE-XR) 500 MG 24 hr tablet; TAKE 2 TABLETS BY MOUTH TWICE DAILY WITH A MEAL.  Dispense: 120 tablet; Refill: 3 - rosuvastatin (CRESTOR) 10 MG tablet; TAKE 1 TABLET(10 MG) BY MOUTH DAILY  Dispense: 30 tablet; Refill: 3   Patient was given the opportunity to ask questions.  Patient verbalized understanding of the plan and was able to repeat key elements of the plan. Patient was given clear instructions to go to Emergency  Department or return to medical center if symptoms don't improve, worsen, or new problems develop.The patient verbalized understanding.   Camillia Herter, NP

## 2019-11-02 ENCOUNTER — Encounter: Payer: Self-pay | Admitting: Family

## 2019-11-02 ENCOUNTER — Other Ambulatory Visit: Payer: Self-pay

## 2019-11-02 ENCOUNTER — Telehealth: Payer: Self-pay | Admitting: Family Medicine

## 2019-11-02 ENCOUNTER — Ambulatory Visit: Payer: Self-pay | Attending: Family | Admitting: Family

## 2019-11-02 VITALS — BP 98/65 | HR 65 | Ht 66.0 in | Wt 202.6 lb

## 2019-11-02 DIAGNOSIS — E119 Type 2 diabetes mellitus without complications: Secondary | ICD-10-CM

## 2019-11-02 LAB — POCT GLYCOSYLATED HEMOGLOBIN (HGB A1C): HbA1c, POC (controlled diabetic range): 5.9 % (ref 0.0–7.0)

## 2019-11-02 LAB — GLUCOSE, POCT (MANUAL RESULT ENTRY): POC Glucose: 129 mg/dl — AB (ref 70–99)

## 2019-11-02 MED ORDER — GABAPENTIN 300 MG PO CAPS: 300.0000 mg | ORAL_CAPSULE | Freq: Every day | ORAL | 3 refills | Status: DC

## 2019-11-02 MED ORDER — METFORMIN HCL ER 500 MG PO TB24: ORAL_TABLET | ORAL | 3 refills | Status: DC

## 2019-11-02 MED ORDER — ROSUVASTATIN CALCIUM 10 MG PO TABS: ORAL_TABLET | ORAL | 3 refills | Status: DC

## 2019-11-02 NOTE — Telephone Encounter (Signed)
PT HAD OFFICE VISIT WITH aMY 11/02/19.

## 2019-11-02 NOTE — Patient Instructions (Addendum)
Continue Metformin for diabetes. Adding Gabapentin for diabetic neuropathy. Continue Crestor for cholesterol. Follow-up with primary physician in 4 months or sooner if needed. Diabetes Basics  Diabetes (diabetes mellitus) is a long-term (chronic) disease. It occurs when the body does not properly use sugar (glucose) that is released from food after you eat. Diabetes may be caused by one or both of these problems:  Your pancreas does not make enough of a hormone called insulin.  Your body does not react in a normal way to insulin that it makes. Insulin lets sugars (glucose) go into cells in your body. This gives you energy. If you have diabetes, sugars cannot get into cells. This causes high blood sugar (hyperglycemia). Follow these instructions at home: How is diabetes treated? You may need to take insulin or other diabetes medicines daily to keep your blood sugar in balance. Take your diabetes medicines every day as told by your doctor. List your diabetes medicines here: Diabetes medicines  Name of medicine: ______________________________ ? Amount (dose): _______________ Time (a.m./p.m.): _______________ Notes: ___________________________________  Name of medicine: ______________________________ ? Amount (dose): _______________ Time (a.m./p.m.): _______________ Notes: ___________________________________  Name of medicine: ______________________________ ? Amount (dose): _______________ Time (a.m./p.m.): _______________ Notes: ___________________________________ If you use insulin, you will learn how to give yourself insulin by injection. You may need to adjust the amount based on the food that you eat. List the types of insulin you use here: Insulin  Insulin type: ______________________________ ? Amount (dose): _______________ Time (a.m./p.m.): _______________ Notes: ___________________________________  Insulin type: ______________________________ ? Amount (dose): _______________ Time  (a.m./p.m.): _______________ Notes: ___________________________________  Insulin type: ______________________________ ? Amount (dose): _______________ Time (a.m./p.m.): _______________ Notes: ___________________________________  Insulin type: ______________________________ ? Amount (dose): _______________ Time (a.m./p.m.): _______________ Notes: ___________________________________  Insulin type: ______________________________ ? Amount (dose): _______________ Time (a.m./p.m.): _______________ Notes: ___________________________________ How do I manage my blood sugar?  Check your blood sugar levels using a blood glucose monitor as directed by your doctor. Your doctor will set treatment goals for you. Generally, you should have these blood sugar levels:  Before meals (preprandial): 80-130 mg/dL (9.4-7.0 mmol/L).  After meals (postprandial): below 180 mg/dL (10 mmol/L).  A1c level: less than 7%. Write down the times that you will check your blood sugar levels: Blood sugar checks  Time: _______________ Notes: ___________________________________  Time: _______________ Notes: ___________________________________  Time: _______________ Notes: ___________________________________  Time: _______________ Notes: ___________________________________  Time: _______________ Notes: ___________________________________  Time: _______________ Notes: ___________________________________  What do I need to know about low blood sugar? Low blood sugar is called hypoglycemia. This is when blood sugar is at or below 70 mg/dL (3.9 mmol/L). Symptoms may include:  Feeling: ? Hungry. ? Worried or nervous (anxious). ? Sweaty and clammy. ? Confused. ? Dizzy. ? Sleepy. ? Sick to your stomach (nauseous).  Having: ? A fast heartbeat. ? A headache. ? A change in your vision. ? Tingling or no feeling (numbness) around the mouth, lips, or tongue. ? Jerky movements that you cannot control  (seizure).  Having trouble with: ? Moving (coordination). ? Sleeping. ? Passing out (fainting). ? Getting upset easily (irritability). Treating low blood sugar To treat low blood sugar, eat or drink something sugary right away. If you can think clearly and swallow safely, follow the 15:15 rule:  Take 15 grams of a fast-acting carb (carbohydrate). Talk with your doctor about how much you should take.  Some fast-acting carbs are: ? Sugar tablets (glucose pills). Take 3-4 glucose pills. ? 6-8 pieces of hard candy. ? 4-6 oz (  120-150 mL) of fruit juice. ? 4-6 oz (120-150 mL) of regular (not diet) soda. ? 1 Tbsp (15 mL) honey or sugar.  Check your blood sugar 15 minutes after you take the carb.  If your blood sugar is still at or below 70 mg/dL (3.9 mmol/L), take 15 grams of a carb again.  If your blood sugar does not go above 70 mg/dL (3.9 mmol/L) after 3 tries, get help right away.  After your blood sugar goes back to normal, eat a meal or a snack within 1 hour. Treating very low blood sugar If your blood sugar is at or below 54 mg/dL (3 mmol/L), you have very low blood sugar (severe hypoglycemia). This is an emergency. Do not wait to see if the symptoms will go away. Get medical help right away. Call your local emergency services (911 in the [place] Do not drive yourself to the hospital. Questions to ask your health care provider  Do I need to meet with a diabetes educator?  What equipment will I need to care for myself at home?  What diabetes medicines do I need? When should I take them?  How often do I need to check my blood sugar?  What number can I call if I have questions?  When is my next doctor's visit?  Where can I find a support group for people with diabetes? Where to find more information  American Diabetes Association: www.diabetes.org  American Association of Diabetes Educators: www.diabeteseducator.org/patient-resources Contact a doctor if:  Your blood  sugar is at or above 240 mg/dL (74.1 mmol/L) for 2 days in a row.  You have been sick or have had a fever for 2 days or more, and you are not getting better.  You have any of these problems for more than 6 hours: ? You cannot eat or drink. ? You feel sick to your stomach (nauseous). ? You throw up (vomit). ? You have watery poop (diarrhea). Get help right away if:  Your blood sugar is lower than 54 mg/dL (3 mmol/L).  You get confused.  You have trouble: ? Thinking clearly. ? Breathing. Summary  Diabetes (diabetes mellitus) is a long-term (chronic) disease. It occurs when the body does not properly use sugar (glucose) that is released from food after digestion.  Take insulin and diabetes medicines as told.  Check your blood sugar every day, as often as told.  Keep all follow-up visits as told by your doctor. This is important. This information is not intended to replace advice given to you by your health care provider. Make sure you discuss any questions you have with your health care provider. Document Revised: 01/10/2019 Document Reviewed: 07/22/2017 Elsevier Patient Education  2020 [employer].

## 2019-11-02 NOTE — Telephone Encounter (Signed)
Pt says that she was prescribed gabapentin, pt says that she is unable to take that medication. Pt says that medication has to be Non narcotic.

## 2019-11-03 NOTE — Progress Notes (Signed)
CBG and A1C discussed in clinic.

## 2019-11-22 ENCOUNTER — Encounter (HOSPITAL_COMMUNITY): Payer: Self-pay | Admitting: Emergency Medicine

## 2019-11-22 ENCOUNTER — Emergency Department (HOSPITAL_COMMUNITY)
Admission: EM | Admit: 2019-11-22 | Discharge: 2019-11-22 | Disposition: A | Discharge status: Home / Self Care | Payer: Self-pay | Attending: Emergency Medicine | Admitting: Emergency Medicine

## 2019-11-22 ENCOUNTER — Emergency Department (HOSPITAL_COMMUNITY): Payer: Self-pay

## 2019-11-22 DIAGNOSIS — Z87891 Personal history of nicotine dependence: Secondary | ICD-10-CM | POA: Insufficient documentation

## 2019-11-22 DIAGNOSIS — E119 Type 2 diabetes mellitus without complications: Secondary | ICD-10-CM | POA: Insufficient documentation

## 2019-11-22 DIAGNOSIS — Z7984 Long term (current) use of oral hypoglycemic drugs: Secondary | ICD-10-CM | POA: Insufficient documentation

## 2019-11-22 DIAGNOSIS — R079 Chest pain, unspecified: Secondary | ICD-10-CM

## 2019-11-22 DIAGNOSIS — R0789 Other chest pain: Secondary | ICD-10-CM | POA: Insufficient documentation

## 2019-11-22 LAB — BASIC METABOLIC PANEL
Anion gap: 11 (ref 5–15)
BUN: 15 mg/dL (ref 6–20)
CO2: 25 mmol/L (ref 22–32)
Calcium: 8.7 mg/dL — ABNORMAL LOW (ref 8.9–10.3)
Chloride: 104 mmol/L (ref 98–111)
Creatinine, Ser: 0.67 mg/dL (ref 0.44–1.00)
GFR calc Af Amer: >60 mL/min (ref >60)
GFR calc non Af Amer: >60 mL/min (ref >60)
Glucose, Bld: 96 mg/dL (ref 70–99)
Potassium: 3.6 mmol/L (ref 3.5–5.1)
Sodium: 140 mmol/L (ref 135–145)

## 2019-11-22 LAB — CBC
HCT: 37.6 % (ref 36.0–46.0)
Hemoglobin: 12.3 g/dL (ref 12.0–15.0)
MCH: 30.9 pg (ref 26.0–34.0)
MCHC: 32.7 g/dL (ref 30.0–36.0)
MCV: 94.5 fL (ref 80.0–100.0)
Platelets: 189 10*3/uL (ref 150–400)
RBC: 3.98 MIL/uL (ref 3.87–5.11)
RDW: 12 % (ref 11.5–15.5)
WBC: 6.4 10*3/uL (ref 4.0–10.5)
nRBC: 0 % (ref 0.0–0.2)

## 2019-11-22 LAB — TROPONIN I (HIGH SENSITIVITY)
Troponin I (High Sensitivity): 4 ng/L (ref <18)
Troponin I (High Sensitivity): 3 ng/L (ref <18)

## 2019-11-22 LAB — I-STAT BETA HCG BLOOD, ED (MC, WL, AP ONLY): I-stat hCG, quantitative: <5 m[IU]/mL (ref <5)

## 2019-11-22 MED ORDER — ASPIRIN 81 MG PO CHEW
324.0000 mg | CHEWABLE_TABLET | Freq: Once | ORAL | Status: AC
  Administered 2019-11-22: 324 mg via ORAL
  Filled 2019-11-22: qty 4

## 2019-11-22 MED ORDER — SODIUM CHLORIDE 0.9% FLUSH: 3.0000 mL | Freq: Once | INTRAVENOUS | Status: DC

## 2019-11-22 NOTE — ED Triage Notes (Signed)
Pt reports sob and central chest pain that started suddenly this morning while cooking. resp e/u, nad.

## 2019-11-22 NOTE — ED Provider Notes (Signed)
[facility] EMERGENCY DEPARTMENT Provider Note   CSN: [id number] Arrival date & time: 11/22/19  1017     History Chief Complaint  Patient presents with  . Shortness of Breath    Monica Mcfarland is a 49 y.o. female.  HPI   This patient is a 49 year old female, she is a type II diabetic as well as hypercholesterol.  She takes Metformin twice a day, does not smoke and does not drink.  She has never had any cardiac disease, she reports that she is out of shape and often gets winded when she tries to go up and down the stairs at her home, she does not typically have chest pain.  Today while she was resting she started to develop sharp left-sided upper chest pain with no radiation, no shortness of breath, no swelling of the legs.  This would last 2 or 3 minutes and then totally resolve, it has been coming and going throughout the afternoon, her last episode was 2 hours ago.  She denies fevers chills, she has had no vomiting but does have chronic diarrhea.  She has taken some Tylenol for the chest pain earlier, this did not seem to help very much.  She is concerned because her mother has a history of cardiac disease, this patient states she has never had any heart or lung disease, no liver or kidney disease.  She has never been seen by a cardiologist.  History reviewed. No pertinent past medical history.  Patient Active Problem List   Diagnosis Date Noted  . Type 2 diabetes mellitus ([place]) 07/15/2019    Past Surgical History:  Procedure Laterality Date  . CESAREAN SECTION     3 children      OB History   No obstetric history on file.     Family History  Problem Relation Age of Onset  . Diabetes Mother   . Heart disease Mother   . Diabetes Father   . Hearing loss Maternal Grandmother   . Hearing loss Maternal Grandfather   . Diabetes Sister     Social History   Tobacco Use  . Smoking status: Former Smoker    Types: Cigarettes    Start date: 07/12/2015   . Smokeless tobacco: Never Used  Substance Use Topics  . Alcohol use: Not Currently  . Drug use: Not Currently    Home Medications Prior to Admission medications   Medication Sig Start Date End Date Taking? Authorizing Provider  amoxicillin (AMOXIL) 500 MG capsule Take 1 capsule (500 mg total) by mouth 2 (two) times daily. 06/28/19   Fulp, Cammie, MD  Blood Glucose Monitoring Suppl (TRUE METRIX METER) w/Device KIT Use to check blood sugars once per day either fasting/2 hours after a meal or before bedtime 07/12/19   Fulp, Cammie, MD  gabapentin (NEURONTIN) 300 MG capsule Take 1 capsule (300 mg total) by mouth at bedtime. 11/02/19   Camillia Herter, NP  glucose blood (TRUE METRIX BLOOD GLUCOSE TEST) test strip Use as instructed to check blood sugar once per day 07/12/19   Fulp, Cammie, MD  ibuprofen (ADVIL) 600 MG tablet Take 1 tablet (600 mg total) by mouth every 8 (eight) hours as needed. For pain; eat before taking medication 07/12/19   Fulp, Cammie, MD  metFORMIN (GLUCOPHAGE-XR) 500 MG 24 hr tablet TAKE 2 TABLETS BY MOUTH TWICE DAILY WITH A MEAL. 11/02/19   Minette Brine, Amy J, NP  rosuvastatin (CRESTOR) 10 MG tablet TAKE 1 TABLET(10 MG) BY MOUTH DAILY 11/02/19  Camillia Herter, NP  TRUEplus Lancets 28G MISC Use once daily when checking blood sugars 07/12/19   Fulp, Ander Gaster, MD    Allergies    Shellfish allergy  Review of Systems   Review of Systems  All other systems reviewed and are negative.   Physical Exam Updated Vital Signs BP 106/85 (BP Location: Left Arm)   Pulse 56   Temp 98 F (36.7 C) (Oral)   Resp 14   SpO2 100%   Physical Exam Vitals and nursing note reviewed.  Constitutional:      General: She is not in acute distress.    Appearance: She is well-developed.  HENT:     Head: Normocephalic and atraumatic.     Mouth/Throat:     Pharynx: No oropharyngeal exudate.  Eyes:     General: No scleral icterus.       Right eye: No discharge.        Left eye: No discharge.      Conjunctiva/sclera: Conjunctivae normal.     Pupils: Pupils are equal, round, and reactive to light.  Neck:     Thyroid: No thyromegaly.     Vascular: No JVD.  Cardiovascular:     Rate and Rhythm: Normal rate and regular rhythm.     Heart sounds: Normal heart sounds. No murmur heard.  No friction rub. No gallop.   Pulmonary:     Effort: Pulmonary effort is normal. No respiratory distress.     Breath sounds: Normal breath sounds. No wheezing or rales.  Abdominal:     General: Bowel sounds are normal. There is no distension.     Palpations: Abdomen is soft. There is no mass.     Tenderness: There is no abdominal tenderness.  Musculoskeletal:        General: No tenderness. Normal range of motion.     Cervical back: Normal range of motion and neck supple.  Lymphadenopathy:     Cervical: No cervical adenopathy.  Skin:    General: Skin is warm and dry.     Findings: No erythema or rash.  Neurological:     Mental Status: She is alert.     Coordination: Coordination normal.  Psychiatric:        Behavior: Behavior normal.     ED Results / Procedures / Treatments   Labs (all labs ordered are listed, but only abnormal results are displayed) Labs Reviewed  BASIC METABOLIC PANEL - Abnormal; Notable for the following components:      Result Value   Calcium 8.7 (*)    All other components within normal limits  CBC  I-STAT BETA HCG BLOOD, ED ([facility], [facility], [facility] ONLY)  TROPONIN I (HIGH SENSITIVITY)  TROPONIN I (HIGH SENSITIVITY)    EKG EKG Interpretation  Date/Time:  Thursday November 22 2019 10:32:40 EDT Ventricular Rate:  61 PR Interval:  136 QRS Duration: 96 QT Interval:  420 QTC Calculation: 422 R Axis:   70 Text Interpretation: Normal sinus rhythm Normal ECG No old tracing to compare Confirmed by Noemi Chapel [phone]) on 11/22/2019 3:54:52 PM   Radiology DG Chest 2 View  Result Date: 11/22/2019 CLINICAL DATA:  Chest pain and shortness of breath. EXAM: CHEST - 2 VIEW COMPARISON:   None. FINDINGS: The cardiomediastinal silhouette is within normal limits. There are mild interstitial type densities in both lung bases. The upper lungs are clear. No overt pulmonary edema, pleural effusion, pneumothorax is identified. No acute osseous abnormality is seen. IMPRESSION: Mild bibasilar opacities which may  reflect atelectasis. Electronically Signed   By: Logan Bores M.D.   On: 11/22/2019 11:09    Procedures Procedures (including critical care time)  Medications Ordered in ED Medications  sodium chloride flush (NS) 0.9 % injection 3 mL (has no administration in time range)  aspirin chewable tablet 324 mg (324 mg Oral Given 11/22/19 1622)    ED Course  I have reviewed the triage vital signs and the nursing notes.  Pertinent labs & imaging results that were available during my care of the patient were reviewed by me and considered in my medical decision making (see chart for details).    MDM Rules/Calculators/A&P                          This patient's EKG is unremarkable, her first troponin was normal, the rest of her labs are also normal like a metabolic panel and a CBC.  Her chest x-ray shows atelectasis but no signs of infiltrate or pneumothorax.  Her vital signs reflect a blood pressure of 106/85, pulse of 56 respirations of 14 oxygen of 100% and afebrile at 98 degrees.  She will get aspirin, second troponin, this does not sound consistent with pulmonary embolism especially with her bradycardia and normal oxygen level.  The patient is agreeable to follow-up with cardiology as an outpatient if the troponin is not going up.  Second troponin is 3, this is very reassuring, the patient was given instructions on close follow-up with cardiology and with emergency department should symptoms worsen, agreeable.  Chest pain-free at the time of discharge.  Recommended baby aspirin daily until she follows up with cardiology  Final Clinical Impression(s) / ED Diagnoses Final diagnoses:   Left-sided chest pain      Noemi Chapel, MD 11/22/19 [phone]

## 2019-11-22 NOTE — Discharge Instructions (Signed)
Please take a baby aspirin daily until you follow-up with a cardiologist, see the phone number above for local cardiology, you should be seen within the next week however return to the emergency department if you continue to have pain or if it is getting worse

## 2019-11-23 ENCOUNTER — Emergency Department (HOSPITAL_COMMUNITY)
Admission: EM | Admit: 2019-11-23 | Discharge: 2019-11-23 | Disposition: A | Discharge status: Home / Self Care | Payer: Self-pay | Attending: Emergency Medicine | Admitting: Emergency Medicine

## 2019-11-23 ENCOUNTER — Other Ambulatory Visit: Payer: Self-pay

## 2019-11-23 ENCOUNTER — Encounter (HOSPITAL_COMMUNITY): Payer: Self-pay | Admitting: Pediatrics

## 2019-11-23 DIAGNOSIS — Z87891 Personal history of nicotine dependence: Secondary | ICD-10-CM | POA: Insufficient documentation

## 2019-11-23 DIAGNOSIS — E119 Type 2 diabetes mellitus without complications: Secondary | ICD-10-CM | POA: Insufficient documentation

## 2019-11-23 DIAGNOSIS — R110 Nausea: Secondary | ICD-10-CM | POA: Insufficient documentation

## 2019-11-23 DIAGNOSIS — Z7984 Long term (current) use of oral hypoglycemic drugs: Secondary | ICD-10-CM | POA: Insufficient documentation

## 2019-11-23 DIAGNOSIS — R0789 Other chest pain: Secondary | ICD-10-CM | POA: Insufficient documentation

## 2019-11-23 LAB — BASIC METABOLIC PANEL
Anion gap: 8 (ref 5–15)
BUN: 13 mg/dL (ref 6–20)
CO2: 28 mmol/L (ref 22–32)
Calcium: 9.2 mg/dL (ref 8.9–10.3)
Chloride: 103 mmol/L (ref 98–111)
Creatinine, Ser: 0.75 mg/dL (ref 0.44–1.00)
GFR calc Af Amer: >60 mL/min (ref >60)
GFR calc non Af Amer: >60 mL/min (ref >60)
Glucose, Bld: 118 mg/dL — ABNORMAL HIGH (ref 70–99)
Potassium: 3.8 mmol/L (ref 3.5–5.1)
Sodium: 139 mmol/L (ref 135–145)

## 2019-11-23 LAB — CBC
HCT: 40.1 % (ref 36.0–46.0)
Hemoglobin: 13.1 g/dL (ref 12.0–15.0)
MCH: 30.5 pg (ref 26.0–34.0)
MCHC: 32.7 g/dL (ref 30.0–36.0)
MCV: 93.3 fL (ref 80.0–100.0)
Platelets: 211 10*3/uL (ref 150–400)
RBC: 4.3 MIL/uL (ref 3.87–5.11)
RDW: 11.9 % (ref 11.5–15.5)
WBC: 6.6 10*3/uL (ref 4.0–10.5)
nRBC: 0 % (ref 0.0–0.2)

## 2019-11-23 LAB — TROPONIN I (HIGH SENSITIVITY)
Troponin I (High Sensitivity): 4 ng/L (ref <18)
Troponin I (High Sensitivity): 5 ng/L (ref <18)

## 2019-11-23 LAB — I-STAT BETA HCG BLOOD, ED (MC, WL, AP ONLY): I-stat hCG, quantitative: <5 m[IU]/mL (ref <5)

## 2019-11-23 MED ORDER — LIDOCAINE VISCOUS HCL 2 % MT SOLN
15.0000 mL | Freq: Once | OROMUCOSAL | Status: AC
  Administered 2019-11-23: 15 mL via ORAL
  Filled 2019-11-23: qty 15

## 2019-11-23 MED ORDER — FAMOTIDINE 20 MG PO TABS
20.0000 mg | ORAL_TABLET | Freq: Every day | ORAL | 0 refills | Status: DC
Start: 2019-11-23 — End: 2021-03-02

## 2019-11-23 MED ORDER — ALUM & MAG HYDROXIDE-SIMETH 200-200-20 MG/5ML PO SUSP
30.0000 mL | Freq: Once | ORAL | Status: AC
  Administered 2019-11-23: 30 mL via ORAL
  Filled 2019-11-23: qty 30

## 2019-11-23 MED ORDER — SODIUM CHLORIDE 0.9% FLUSH: 3.0000 mL | Freq: Once | INTRAVENOUS | Status: DC

## 2019-11-23 NOTE — ED Triage Notes (Signed)
C/o chest pain, stated was seen yesterday for same issue.

## 2019-11-23 NOTE — ED Provider Notes (Signed)
[facility] EMERGENCY DEPARTMENT Provider Note   CSN: [id number] Arrival date & time: 11/23/19  1318     History Chief Complaint  Patient presents with  . Chest Pain    Monica Mcfarland is a 49 y.o. female presenting for evaluation of chest pain.  Patient states she continues to have intermittent chest pain.  She was seen in the ER yesterday for the same, however today she had an episode which was worse.  Patient states her symptoms first began gradually over the past several weeks to days.  Initially it felt like heartburn, but she states her episodes yesterday and today did not feel like heartburn.  She states she had associated nausea with today's episode.  Occurred around 1:00, approximately 1 hour after eating lunch.  Patient states since then, she has had persistent mild nausea, though her chest pain is improved.  She has not taken anything for her symptoms including Tylenol or ibuprofen.  She did take a baby aspirin today as recommended by the EDP yesterday.  She has an appoint with cardiology scheduled in 4 days.  She denies fevers, chills, cough, shortness of breath, abdominal pain, urinary symptoms, abnormal bowel movements.  She denies recent medication changes besides the aspirin started yesterday.  She reports a family history of heart problems.  She denies tobacco, alcohol, or drug use.  She has a history of diabetes, no history of hypertension.  She denies recent travel, surgeries, mobilization, history of cancer, history of previous DVT/PE, or hormone use.  Additional history and chart review.  Patient with a history of diabetes.  Reviewed ER visit from yesterday including labs and other work-up.  HPI     History reviewed. No pertinent past medical history.  Patient Active Problem List   Diagnosis Date Noted  . Type 2 diabetes mellitus ([place]) 07/15/2019    Past Surgical History:  Procedure Laterality Date  . CESAREAN SECTION     3 children       OB History   No obstetric history on file.     Family History  Problem Relation Age of Onset  . Diabetes Mother   . Heart disease Mother   . Diabetes Father   . Hearing loss Maternal Grandmother   . Hearing loss Maternal Grandfather   . Diabetes Sister     Social History   Tobacco Use  . Smoking status: Former Smoker    Types: Cigarettes    Start date: 07/12/2015  . Smokeless tobacco: Never Used  Substance Use Topics  . Alcohol use: Not Currently  . Drug use: Not Currently    Home Medications Prior to Admission medications   Medication Sig Start Date End Date Taking? Authorizing Provider  amoxicillin (AMOXIL) 500 MG capsule Take 1 capsule (500 mg total) by mouth 2 (two) times daily. 06/28/19   Fulp, Cammie, MD  Blood Glucose Monitoring Suppl (TRUE METRIX METER) w/Device KIT Use to check blood sugars once per day either fasting/2 hours after a meal or before bedtime 07/12/19   Fulp, Cammie, MD  famotidine (PEPCID) 20 MG tablet Take 1 tablet (20 mg total) by mouth daily. 11/23/19   Kipton Skillen, PA-C  gabapentin (NEURONTIN) 300 MG capsule Take 1 capsule (300 mg total) by mouth at bedtime. 11/02/19   Camillia Herter, NP  glucose blood (TRUE METRIX BLOOD GLUCOSE TEST) test strip Use as instructed to check blood sugar once per day 07/12/19   Antony Blackbird, MD  ibuprofen (ADVIL) 600 MG tablet Take  1 tablet (600 mg total) by mouth every 8 (eight) hours as needed. For pain; eat before taking medication 07/12/19   Fulp, Cammie, MD  metFORMIN (GLUCOPHAGE-XR) 500 MG 24 hr tablet TAKE 2 TABLETS BY MOUTH TWICE DAILY WITH A MEAL. 11/02/19   Minette Brine, Amy J, NP  rosuvastatin (CRESTOR) 10 MG tablet TAKE 1 TABLET(10 MG) BY MOUTH DAILY 11/02/19   Camillia Herter, NP  TRUEplus Lancets 28G MISC Use once daily when checking blood sugars 07/12/19   Fulp, Cammie, MD    Allergies    Shellfish allergy  Review of Systems   Review of Systems  Cardiovascular: Positive for chest pain.   Gastrointestinal: Positive for nausea.  All other systems reviewed and are negative.   Physical Exam Updated Vital Signs BP 90/78   Pulse 59   Temp 98.6 F (37 C) (Oral)   Resp (!) 26   Ht 5' 6"  (1.676 m)   Wt 91.6 kg   SpO2 97%   BMI 32.60 kg/m   Physical Exam Vitals and nursing note reviewed.  Constitutional:      General: She is not in acute distress.    Appearance: She is well-developed.     Comments: Resting in the bed in no acute distress.  Does appear slightly anxious  HENT:     Head: Normocephalic and atraumatic.  Eyes:     Extraocular Movements: Extraocular movements intact.     Conjunctiva/sclera: Conjunctivae normal.     Pupils: Pupils are equal, round, and reactive to light.  Cardiovascular:     Rate and Rhythm: Normal rate and regular rhythm.     Pulses: Normal pulses.  Pulmonary:     Effort: Pulmonary effort is normal. No respiratory distress.     Breath sounds: Normal breath sounds. No wheezing.  Abdominal:     General: There is no distension.     Palpations: Abdomen is soft. There is no mass.     Tenderness: There is no abdominal tenderness. There is no guarding or rebound.  Musculoskeletal:        General: Normal range of motion.     Cervical back: Normal range of motion and neck supple.  Skin:    General: Skin is warm and dry.     Capillary Refill: Capillary refill takes less than 2 seconds.  Neurological:     Mental Status: She is alert and oriented to person, place, and time.     ED Results / Procedures / Treatments   Labs (all labs ordered are listed, but only abnormal results are displayed) Labs Reviewed  BASIC METABOLIC PANEL - Abnormal; Notable for the following components:      Result Value   Glucose, Bld 118 (*)    All other components within normal limits  CBC  I-STAT BETA HCG BLOOD, ED ([facility], [facility], [facility] ONLY)  TROPONIN I (HIGH SENSITIVITY)  TROPONIN I (HIGH SENSITIVITY)    EKG EKG Interpretation  Date/Time:  Friday November 23 2019 13:56:25 EDT Ventricular Rate:  64 PR Interval:  144 QRS Duration: 88 QT Interval:  404 QTC Calculation: 416 R Axis:   60 Text Interpretation: Normal sinus rhythm Nonspecific T wave abnormality Abnormal ECG No significant change since last tracing Confirmed by Quintella Reichert [phone]) on 11/23/2019 4:29:01 PM   Radiology DG Chest 2 View  Result Date: 11/22/2019 CLINICAL DATA:  Chest pain and shortness of breath. EXAM: CHEST - 2 VIEW COMPARISON:  None. FINDINGS: The cardiomediastinal silhouette is within normal limits. There are  mild interstitial type densities in both lung bases. The upper lungs are clear. No overt pulmonary edema, pleural effusion, pneumothorax is identified. No acute osseous abnormality is seen. IMPRESSION: Mild bibasilar opacities which may reflect atelectasis. Electronically Signed   By: Logan Bores M.D.   On: 11/22/2019 11:09    Procedures Procedures (including critical care time)  Medications Ordered in ED Medications  sodium chloride flush (NS) 0.9 % injection 3 mL (3 mLs Intravenous Not Given 11/23/19 1954)  alum & mag hydroxide-simeth (MAALOX/MYLANTA) 200-200-20 MG/5ML suspension 30 mL (30 mLs Oral Given 11/23/19 1928)    And  lidocaine (XYLOCAINE) 2 % viscous mouth solution 15 mL (15 mLs Oral Given 11/23/19 1928)    ED Course  I have reviewed the triage vital signs and the nursing notes.  Pertinent labs & imaging results that were available during my care of the patient were reviewed by me and considered in my medical decision making (see chart for details).    MDM Rules/Calculators/A&P                          Patient presenting for evaluation of intermittent episodes of chest pain.  Today, she had associated nausea.  On exam, patient appears nontoxic, although slightly anxious.  Pain is knee not reproducible with palpation of the chest wall, doubt MSK.  Pain to begin 1 hour after eating, consider GERD/reflux, especially as patient states that prior  episodes were attributed to this.  History and exam is not consistent with PE, patient without risk factors and with stable vital signs.  Patient had a normal chest x-ray yesterday, doubt pneumonia, pneumothorax, effusion.  Patient exam not consistent with dissection.  Labs obtained from triage read interpreted by me, overall reassuring.  Electrolytes stable.  Initial troponin negative.  EKG unchanged from previous.  As patient had a new event today, will repeat troponin.  If negative, doubt ACS.  Will give GI cocktail while waiting.  On reassessment after GI cocktail, patient reports significant improvement of pain.  Once again, favor GERD/reflux.  Repeat troponin negative.  Discussed findings with patient.  Discussed continued follow-up with cardiology next week, as well as follow-up with PCP as needed.  Will start patient on Pepcid to try and minimize symptoms.  At this time, patient appears safe for discharge.  Return precautions given.  Patient states she understands and agrees to plan.  Final Clinical Impression(s) / ED Diagnoses Final diagnoses:  Atypical chest pain    Rx / DC Orders ED Discharge Orders         Ordered    famotidine (PEPCID) 20 MG tablet  Daily     Discontinue  Reprint     11/23/19 2046           Franchot Heidelberg, PA-C 11/23/19 2102    Quintella Reichert, MD 11/24/19 [phone]

## 2019-11-23 NOTE — Discharge Instructions (Signed)
Continue taking all your home medications as prescribed. Take your Pepcid daily to decrease symptoms. Follow-up with the cardiologist at your scheduled appointment next week. Follow with your primary care doctor as needed for further evaluation of your pain. Return to the emergency room if you develop severe worsening chest pain, difficulty breathing, any new, worsening, or concerning symptoms.

## 2019-11-27 ENCOUNTER — Ambulatory Visit (INDEPENDENT_AMBULATORY_CARE_PROVIDER_SITE_OTHER): Payer: Self-pay | Admitting: Interventional Cardiology

## 2019-11-27 ENCOUNTER — Encounter: Payer: Self-pay | Admitting: Interventional Cardiology

## 2019-11-27 ENCOUNTER — Other Ambulatory Visit: Payer: Self-pay

## 2019-11-27 VITALS — BP 100/78 | HR 72 | Ht 66.0 in | Wt 198.0 lb

## 2019-11-27 DIAGNOSIS — R072 Precordial pain: Secondary | ICD-10-CM

## 2019-11-27 DIAGNOSIS — Z8249 Family history of ischemic heart disease and other diseases of the circulatory system: Secondary | ICD-10-CM

## 2019-11-27 DIAGNOSIS — E119 Type 2 diabetes mellitus without complications: Secondary | ICD-10-CM

## 2019-11-27 DIAGNOSIS — Z87891 Personal history of nicotine dependence: Secondary | ICD-10-CM

## 2019-11-27 DIAGNOSIS — Z01812 Encounter for preprocedural laboratory examination: Secondary | ICD-10-CM

## 2019-11-27 MED ORDER — METOPROLOL TARTRATE 50 MG PO TABS
ORAL_TABLET | ORAL | 0 refills | Status: DC
Start: 2019-11-27 — End: 2019-12-18

## 2019-11-27 NOTE — Patient Instructions (Addendum)
Medication Instructions:  Your physician recommends that you continue on your current medications as directed. Please refer to the Current Medication list given to you today.  *If you need a refill on your cardiac medications before your next appointment, please call your pharmacy*   Lab Work: None today If you have labs (blood work) drawn today and your tests are completely normal, you will receive your results only by: Marland Kitchen MyChart Message (if you have MyChart) OR . A paper copy in the mail If you have any lab test that is abnormal or we need to change your treatment, we will call you to review the results.   Testing/Procedures: Your physician has requested that you have cardiac CT.       Follow-Up: At [facility], you and your health needs are our priority.  As part of our continuing mission to provide you with exceptional heart care, we have created designated Provider Care Teams.  These Care Teams include your primary Cardiologist (physician) and Advanced Practice Providers (APPs -  Physician Assistants and Nurse Practitioners) who all work together to provide you with the care you need, when you need it.  We recommend signing up for the patient portal called "MyChart".  Sign up information is provided on this After Visit Summary.  MyChart is used to connect with patients for Virtual Visits (Telemedicine).  Patients are able to view lab/test results, encounter notes, upcoming appointments, etc.  Non-urgent messages can be sent to your provider as well.   To learn more about what you can do with MyChart, go to [email or web address].    Your next appointment:   Follow up as needed based on test results.     Other Instructions Your cardiac CT will be scheduled at one of the below locations:   [facility] [address] [place], [place] [address] [phone]  [facility] [address] [place], [place]  [address] [phone]  If scheduled at [facility], please arrive at the [facility] main entrance of [facility] 30 minutes prior to test start time. Proceed to the [facility] Radiology Department (first floor) to check-in and test prep.  If scheduled at [facility], please arrive 15 mins early for check-in and test prep.  Please follow these instructions carefully (unless otherwise directed):    On the Night Before the Test: . Be sure to Drink plenty of water. . Do not consume any caffeinated/decaffeinated beverages or chocolate 12 hours prior to your test. . Do not take any antihistamines 12 hours prior to your test.   On the Day of the Test: . Drink plenty of water. Do not drink any water within one hour of the test. . Do not eat any food 4 hours prior to the test. . You may take your regular medications prior to the test.  . Take metoprolol (Lopressor) 50 mg two hours prior to test. . HOLD metformin the day of the test . FEMALES- please wear underwire-free bra if available          After the Test: . Drink plenty of water. . After receiving IV contrast, you may experience a mild flushed feeling. This is normal. . On occasion, you may experience a mild rash up to 24 hours after the test. This is not dangerous. If this occurs, you can take Benadryl 25 mg and increase your fluid intake. . If you experience trouble breathing, this can be serious.  If it is severe call 911 IMMEDIATELY. If it is mild, please call our office. . Hold metformin for 48 hours after your test.   Once we have confirmed authorization from your insurance company, we will call you to set up a date and time for your test. Based on how quickly your insurance processes prior authorizations requests, please allow up to 4 weeks to be contacted for scheduling your Cardiac CT appointment. Be advised that routine Cardiac CT appointments could be scheduled as many as 8 weeks  after your provider has ordered it.  For non-scheduling related questions, please contact the cardiac imaging nurse navigator should you have any questions/concerns: Marchia Bond, Cardiac Imaging Nurse Navigator Burley Saver, Interim Cardiac Imaging Nurse [facility] Direct Office Dial: [phone]   For scheduling needs, including cancellations and rescheduling, please call Vivien Rota at [phone], option 3.
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
## 2019-11-28 ENCOUNTER — Other Ambulatory Visit: Payer: Self-pay | Admitting: Interventional Cardiology

## 2019-12-18 ENCOUNTER — Telehealth: Payer: Self-pay | Admitting: Interventional Cardiology

## 2019-12-18 MED ORDER — METOPROLOL TARTRATE 50 MG PO TABS: ORAL_TABLET | ORAL | 0 refills | Status: DC

## 2019-12-18 NOTE — Telephone Encounter (Signed)
Adam with [employer] is requesting to discuss an Rx for medication that is to be taking prior to patient's CT scheduled for 12/31/19. Please return call to Adam at [phone].

## 2019-12-18 NOTE — Telephone Encounter (Signed)
Spoke with Madelaine Bhat who states that he called the pharmacy in regards to the medication that the patient is supposed to take prior to her CT scan. I advised him that she is to take metoprolol tartrate 50 mg two hours prior to CT scan. He states that he spoke with the pharmacy and they do not have it. I will contact pharmacy to fill for patient.

## 2019-12-19 ENCOUNTER — Other Ambulatory Visit: Payer: Self-pay | Admitting: Interventional Cardiology

## 2019-12-20 ENCOUNTER — Ambulatory Visit (INDEPENDENT_AMBULATORY_CARE_PROVIDER_SITE_OTHER): Payer: Self-pay

## 2019-12-20 ENCOUNTER — Ambulatory Visit: Payer: Self-pay | Admitting: Podiatry

## 2019-12-20 ENCOUNTER — Other Ambulatory Visit: Payer: Self-pay

## 2019-12-20 ENCOUNTER — Encounter: Payer: Self-pay | Admitting: Podiatry

## 2019-12-20 ENCOUNTER — Telehealth: Payer: Self-pay | Admitting: Interventional Cardiology

## 2019-12-20 VITALS — BP 108/71 | HR 71 | Temp 96.4°F | Resp 16

## 2019-12-20 DIAGNOSIS — M79672 Pain in left foot: Secondary | ICD-10-CM

## 2019-12-20 DIAGNOSIS — M79671 Pain in right foot: Secondary | ICD-10-CM

## 2019-12-20 DIAGNOSIS — M778 Other enthesopathies, not elsewhere classified: Secondary | ICD-10-CM

## 2019-12-20 NOTE — Telephone Encounter (Signed)
Called patient. BMET has been scheduled on 8/26 for Cardiac CT.

## 2019-12-20 NOTE — Progress Notes (Signed)
Subjective:   Patient ID: Monica Mcfarland, female   DOB: 49 y.o.   MRN: [id number]   HPI Patient presents stating that she gets pain on top of both her feet and its been going on for a long time and she states the last 6 months she has noticed more discomfort and also gets some burning shooting pain and has diabetic diabetes. Patient does not smoke her last A1c was 6.7   Review of Systems  All other systems reviewed and are negative.       Objective:  Physical Exam Vitals and nursing note reviewed.  Constitutional:      Appearance: She is well-developed.  Pulmonary:     Effort: Pulmonary effort is normal.  Musculoskeletal:        General: Normal range of motion.  Skin:    General: Skin is warm.  Neurological:     Mental Status: She is alert.     Neurovascular status was found to be intact muscle strength adequate range of motion within normal limits. Patient did have discomfort in the dorsum of both feet with inflammation of the midtarsal joint and did have some slight discomfort also distally.     Assessment:  Stability for extensor tendinitis bilateral with also possibility for neuropathy      Plan:  H&P reviewed condition and x-rays and at this time I recommended injections which were accomplished dorsal and discussed possibility for gabapentin and we will see results of medication decide over the next month whether that is necessary  X-rays indicate that there is some indications of bone changes around the midtarsal joint right over left

## 2019-12-20 NOTE — Patient Instructions (Signed)
Diabetes Mellitus and Foot Care Foot care is an important part of your health, especially when you have diabetes. Diabetes may cause you to have problems because of poor blood flow (circulation) to your feet and legs, which can cause your skin to:  Become thinner and drier.  Break more easily.  Heal more slowly.  Peel and crack. You may also have nerve damage (neuropathy) in your legs and feet, causing decreased feeling in them. This means that you may not notice minor injuries to your feet that could lead to more serious problems. Noticing and addressing any potential problems early is the best way to prevent future foot problems. How to care for your feet Foot hygiene  Wash your feet daily with warm water and mild soap. Do not use hot water. Then, pat your feet and the areas between your toes until they are completely dry. Do not soak your feet as this can dry your skin.  Trim your toenails straight across. Do not dig under them or around the cuticle. File the edges of your nails with an emery board or nail file.  Apply a moisturizing lotion or petroleum jelly to the skin on your feet and to dry, brittle toenails. Use lotion that does not contain alcohol and is unscented. Do not apply lotion between your toes. Shoes and socks  Wear clean socks or stockings every day. Make sure they are not too tight. Do not wear knee-high stockings since they may decrease blood flow to your legs.  Wear shoes that fit properly and have enough cushioning. Always look in your shoes before you put them on to be sure there are no objects inside.  To break in new shoes, wear them for just a few hours a day. This prevents injuries on your feet. Wounds, scrapes, corns, and calluses  Check your feet daily for blisters, cuts, bruises, sores, and redness. If you cannot see the bottom of your feet, use a mirror or ask someone for help.  Do not cut corns or calluses or try to remove them with medicine.  If you  find a minor scrape, cut, or break in the skin on your feet, keep it and the skin around it clean and dry. You may clean these areas with mild soap and water. Do not clean the area with peroxide, alcohol, or iodine.  If you have a wound, scrape, corn, or callus on your foot, look at it several times a day to make sure it is healing and not infected. Check for: ? Redness, swelling, or pain. ? Fluid or blood. ? Warmth. ? Pus or a bad smell. General instructions  Do not cross your legs. This may decrease blood flow to your feet.  Do not use heating pads or hot water bottles on your feet. They may burn your skin. If you have lost feeling in your feet or legs, you may not know this is happening until it is too late.  Protect your feet from hot and cold by wearing shoes, such as at the beach or on hot pavement.  Schedule a complete foot exam at least once a year (annually) or more often if you have foot problems. If you have foot problems, report any cuts, sores, or bruises to your health care provider immediately. Contact a health care provider if:  You have a medical condition that increases your risk of infection and you have any cuts, sores, or bruises on your feet.  You have an injury that is not   healing.  You have redness on your legs or feet.  You feel burning or tingling in your legs or feet.  You have pain or cramps in your legs and feet.  Your legs or feet are numb.  Your feet always feel cold.  You have pain around a toenail. Get help right away if:  You have a wound, scrape, corn, or callus on your foot and: ? You have pain, swelling, or redness that gets worse. ? You have fluid or blood coming from the wound, scrape, corn, or callus. ? Your wound, scrape, corn, or callus feels warm to the touch. ? You have pus or a bad smell coming from the wound, scrape, corn, or callus. ? You have a fever. ? You have a red line going up your leg. Summary  Check your feet every day  for cuts, sores, red spots, swelling, and blisters.  Moisturize feet and legs daily.  Wear shoes that fit properly and have enough cushioning.  If you have foot problems, report any cuts, sores, or bruises to your health care provider immediately.  Schedule a complete foot exam at least once a year (annually) or more often if you have foot problems. This information is not intended to replace advice given to you by your health care provider. Make sure you discuss any questions you have with your health care provider. Document Revised: 01/10/2019 Document Reviewed: 05/21/2016 Elsevier Patient Education  2020 Elsevier Inc.  

## 2019-12-20 NOTE — Telephone Encounter (Signed)
    Pt said [place] called her and she's calling back. No notes on file but she said she needs to talk to her.

## 2019-12-27 ENCOUNTER — Other Ambulatory Visit: Payer: Self-pay | Admitting: *Deleted

## 2019-12-27 ENCOUNTER — Other Ambulatory Visit: Payer: Self-pay

## 2019-12-27 DIAGNOSIS — Z87891 Personal history of nicotine dependence: Secondary | ICD-10-CM

## 2019-12-27 DIAGNOSIS — E119 Type 2 diabetes mellitus without complications: Secondary | ICD-10-CM

## 2019-12-27 DIAGNOSIS — R072 Precordial pain: Secondary | ICD-10-CM

## 2019-12-27 DIAGNOSIS — Z8249 Family history of ischemic heart disease and other diseases of the circulatory system: Secondary | ICD-10-CM

## 2019-12-27 DIAGNOSIS — Z01812 Encounter for preprocedural laboratory examination: Secondary | ICD-10-CM

## 2019-12-27 LAB — BASIC METABOLIC PANEL
BUN/Creatinine Ratio: 29 — ABNORMAL HIGH (ref 9–23)
BUN: 21 mg/dL (ref 6–24)
CO2: 27 mmol/L (ref 20–29)
Calcium: 9.5 mg/dL (ref 8.7–10.2)
Chloride: 103 mmol/L (ref 96–106)
Creatinine, Ser: 0.73 mg/dL (ref 0.57–1.00)
GFR calc Af Amer: 112 mL/min/{1.73_m2} (ref >59)
GFR calc non Af Amer: 97 mL/min/{1.73_m2} (ref >59)
Glucose: 92 mg/dL (ref 65–99)
Potassium: 4.8 mmol/L (ref 3.5–5.2)
Sodium: 142 mmol/L (ref 134–144)

## 2019-12-28 ENCOUNTER — Other Ambulatory Visit: Payer: Self-pay | Admitting: Podiatry

## 2019-12-28 ENCOUNTER — Telehealth (HOSPITAL_COMMUNITY): Payer: Self-pay | Admitting: Emergency Medicine

## 2019-12-28 DIAGNOSIS — M778 Other enthesopathies, not elsewhere classified: Secondary | ICD-10-CM

## 2019-12-28 NOTE — Telephone Encounter (Signed)
Reaching out to patient to offer assistance regarding upcoming cardiac imaging study; pt verbalizes understanding of appt date/time, parking situation and where to check in, pre-test NPO status and medications ordered, and verified current allergies; name and call back number provided for further questions should they arise Tyona Nilsen RN Navigator Cardiac Imaging [facility] [phone] office [phone] cell 

## 2019-12-31 ENCOUNTER — Ambulatory Visit (HOSPITAL_COMMUNITY)
Admission: RE | Admit: 2019-12-31 | Discharge: 2019-12-31 | Disposition: A | Discharge status: Home / Self Care | Payer: Self-pay | Source: Ambulatory Visit | Attending: Interventional Cardiology | Admitting: Interventional Cardiology

## 2019-12-31 ENCOUNTER — Telehealth: Payer: Self-pay | Admitting: Interventional Cardiology

## 2019-12-31 ENCOUNTER — Other Ambulatory Visit: Payer: Self-pay

## 2019-12-31 DIAGNOSIS — R072 Precordial pain: Secondary | ICD-10-CM

## 2019-12-31 MED ORDER — NITROGLYCERIN 0.4 MG SL SUBL
0.8000 mg | SUBLINGUAL_TABLET | Freq: Once | SUBLINGUAL | Status: AC
  Administered 2019-12-31: 0.8 mg via SUBLINGUAL

## 2019-12-31 MED ORDER — IOHEXOL 350 MG/ML SOLN
80.0000 mL | Freq: Once | INTRAVENOUS | Status: AC | PRN
  Administered 2019-12-31: 80 mL via INTRAVENOUS

## 2019-12-31 MED ORDER — NITROGLYCERIN 0.4 MG SL SUBL
SUBLINGUAL_TABLET | SUBLINGUAL | Status: AC
  Filled 2019-12-31: qty 2

## 2019-12-31 NOTE — Telephone Encounter (Signed)
    I went in opt's chart to see who called pt this morning. It was [place], call was transferred to her.

## 2020-01-08 ENCOUNTER — Telehealth: Payer: Self-pay | Admitting: Interventional Cardiology

## 2020-01-08 NOTE — Telephone Encounter (Signed)
Patient calling for CT results. 

## 2020-01-08 NOTE — Telephone Encounter (Signed)
-----   Message from Corky Crafts, MD sent at 01/04/2020  1:24 PM EDT ----- No CAD.  Patent coroanary arteries with calcium score of 0.  Some mucus noted in small airways in the lungs.  Please cc PMD for further management of the lung findings.

## 2020-01-08 NOTE — Telephone Encounter (Signed)
Returned call to pt, left a message for her to call back. 

## 2020-01-08 NOTE — Telephone Encounter (Signed)
The patient has been notified of the result and verbalized understanding.  All questions (if any) were answered. Results forwarded to PCP. Lattie Haw, RN 01/08/2020 9:45 AM

## 2020-03-05 ENCOUNTER — Ambulatory Visit: Payer: Self-pay | Admitting: Family Medicine

## 2020-03-14 ENCOUNTER — Other Ambulatory Visit: Payer: Self-pay | Admitting: Family Medicine

## 2020-03-14 DIAGNOSIS — E119 Type 2 diabetes mellitus without complications: Secondary | ICD-10-CM

## 2020-04-01 ENCOUNTER — Ambulatory Visit: Payer: Medicaid Other | Attending: Internal Medicine | Admitting: Family

## 2020-04-01 ENCOUNTER — Ambulatory Visit (HOSPITAL_BASED_OUTPATIENT_CLINIC_OR_DEPARTMENT_OTHER): Payer: Medicaid Other | Admitting: Pharmacist

## 2020-04-01 ENCOUNTER — Encounter: Payer: Self-pay | Admitting: Pharmacist

## 2020-04-01 ENCOUNTER — Encounter: Payer: Self-pay | Admitting: Family

## 2020-04-01 ENCOUNTER — Other Ambulatory Visit: Payer: Self-pay

## 2020-04-01 VITALS — BP 127/68 | HR 85 | Temp 97.0°F | Resp 16 | Ht 66.0 in | Wt 211.2 lb

## 2020-04-01 DIAGNOSIS — E119 Type 2 diabetes mellitus without complications: Secondary | ICD-10-CM

## 2020-04-01 DIAGNOSIS — Z5320 Procedure and treatment not carried out because of patient's decision for unspecified reasons: Secondary | ICD-10-CM

## 2020-04-01 DIAGNOSIS — Z23 Encounter for immunization: Secondary | ICD-10-CM

## 2020-04-01 DIAGNOSIS — Z1159 Encounter for screening for other viral diseases: Secondary | ICD-10-CM

## 2020-04-01 DIAGNOSIS — Z1231 Encounter for screening mammogram for malignant neoplasm of breast: Secondary | ICD-10-CM

## 2020-04-01 DIAGNOSIS — Z7689 Persons encountering health services in other specified circumstances: Secondary | ICD-10-CM

## 2020-04-01 LAB — POCT GLYCOSYLATED HEMOGLOBIN (HGB A1C): HbA1c, POC (prediabetic range): 6.1 % (ref 5.7–6.4)

## 2020-04-01 LAB — GLUCOSE, POCT (MANUAL RESULT ENTRY): POC Glucose: 164 mg/dl — AB (ref 70–99)

## 2020-04-01 MED ORDER — ROSUVASTATIN CALCIUM 10 MG PO TABS: ORAL_TABLET | ORAL | 0 refills | Status: DC

## 2020-04-01 MED ORDER — METFORMIN HCL ER 500 MG PO TB24: ORAL_TABLET | ORAL | 2 refills | Status: DC
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
## 2020-04-01 NOTE — Patient Instructions (Addendum)
Continue Metformin, Lantus, and Gabapentin for diabetes.   Lab today.   Flu vaccine today.   Referral for diabetic eye exam.   Referral for mammogram. Apply for mammogram scholarship.   Follow-up with primary physician in 3 months for diabetes check-up.  Return in 1 month for PAP smear. Diabetes Basics  Diabetes (diabetes mellitus) is a long-term (chronic) disease. It occurs when the body does not properly use sugar (glucose) that is released from food after you eat. Diabetes may be caused by one or both of these problems:  Your pancreas does not make enough of a hormone called insulin.  Your body does not react in a normal way to insulin that it makes. Insulin lets sugars (glucose) go into cells in your body. This gives you energy. If you have diabetes, sugars cannot get into cells. This causes high blood sugar (hyperglycemia). Follow these instructions at home: How is diabetes treated? You may need to take insulin or other diabetes medicines daily to keep your blood sugar in balance. Take your diabetes medicines every day as told by your doctor. List your diabetes medicines here: Diabetes medicines  Name of medicine: ______________________________ ? Amount (dose): _______________ Time (a.m./p.m.): _______________ Notes: ___________________________________  Name of medicine: ______________________________ ? Amount (dose): _______________ Time (a.m./p.m.): _______________ Notes: ___________________________________  Name of medicine: ______________________________ ? Amount (dose): _______________ Time (a.m./p.m.): _______________ Notes: ___________________________________ If you use insulin, you will learn how to give yourself insulin by injection. You may need to adjust the amount based on the food that you eat. List the types of insulin you use here: Insulin  Insulin type: ______________________________ ? Amount (dose): _______________ Time (a.m./p.m.): _______________  Notes: ___________________________________  Insulin type: ______________________________ ? Amount (dose): _______________ Time (a.m./p.m.): _______________ Notes: ___________________________________  Insulin type: ______________________________ ? Amount (dose): _______________ Time (a.m./p.m.): _______________ Notes: ___________________________________  Insulin type: ______________________________ ? Amount (dose): _______________ Time (a.m./p.m.): _______________ Notes: ___________________________________  Insulin type: ______________________________ ? Amount (dose): _______________ Time (a.m./p.m.): _______________ Notes: ___________________________________ How do I manage my blood sugar?  Check your blood sugar levels using a blood glucose monitor as directed by your doctor. Your doctor will set treatment goals for you. Generally, you should have these blood sugar levels:  Before meals (preprandial): 80-130 mg/dL (7.0-2.6 mmol/L).  After meals (postprandial): below 180 mg/dL (10 mmol/L).  A1c level: less than 7%. Write down the times that you will check your blood sugar levels: Blood sugar checks  Time: _______________ Notes: ___________________________________  Time: _______________ Notes: ___________________________________  Time: _______________ Notes: ___________________________________  Time: _______________ Notes: ___________________________________  Time: _______________ Notes: ___________________________________  Time: _______________ Notes: ___________________________________  What do I need to know about low blood sugar? Low blood sugar is called hypoglycemia. This is when blood sugar is at or below 70 mg/dL (3.9 mmol/L). Symptoms may include:  Feeling: ? Hungry. ? Worried or nervous (anxious). ? Sweaty and clammy. ? Confused. ? Dizzy. ? Sleepy. ? Sick to your stomach (nauseous).  Having: ? A fast heartbeat. ? A headache. ? A change in your  vision. ? Tingling or no feeling (numbness) around the mouth, lips, or tongue. ? Jerky movements that you cannot control (seizure).  Having trouble with: ? Moving (coordination). ? Sleeping. ? Passing out (fainting). ? Getting upset easily (irritability). Treating low blood sugar To treat low blood sugar, eat or drink something sugary right away. If you can think clearly and swallow safely, follow the 15:15 rule:  Take 15 grams of a fast-acting carb (carbohydrate). Talk with your doctor  about how much you should take.  Some fast-acting carbs are: ? Sugar tablets (glucose pills). Take 3-4 glucose pills. ? 6-8 pieces of hard candy. ? 4-6 oz (120-150 mL) of fruit juice. ? 4-6 oz (120-150 mL) of regular (not diet) soda. ? 1 Tbsp (15 mL) honey or sugar.  Check your blood sugar 15 minutes after you take the carb.  If your blood sugar is still at or below 70 mg/dL (3.9 mmol/L), take 15 grams of a carb again.  If your blood sugar does not go above 70 mg/dL (3.9 mmol/L) after 3 tries, get help right away.  After your blood sugar goes back to normal, eat a meal or a snack within 1 hour. Treating very low blood sugar If your blood sugar is at or below 54 mg/dL (3 mmol/L), you have very low blood sugar (severe hypoglycemia). This is an emergency. Do not wait to see if the symptoms will go away. Get medical help right away. Call your local emergency services (911 in the [place] Do not drive yourself to the hospital. Questions to ask your health care provider  Do I need to meet with a diabetes educator?  What equipment will I need to care for myself at home?  What diabetes medicines do I need? When should I take them?  How often do I need to check my blood sugar?  What number can I call if I have questions?  When is my next doctor's visit?  Where can I find a support group for people with diabetes? Where to find more information  American Diabetes Association:  www.diabetes.org  American Association of Diabetes Educators: www.diabeteseducator.org/patient-resources Contact a doctor if:  Your blood sugar is at or above 240 mg/dL (13.3 mmol/L) for 2 days in a row.  You have been sick or have had a fever for 2 days or more, and you are not getting better.  You have any of these problems for more than 6 hours: ? You cannot eat or drink. ? You feel sick to your stomach (nauseous). ? You throw up (vomit). ? You have watery poop (diarrhea). Get help right away if:  Your blood sugar is lower than 54 mg/dL (3 mmol/L).  You get confused.  You have trouble: ? Thinking clearly. ? Breathing. Summary  Diabetes (diabetes mellitus) is a long-term (chronic) disease. It occurs when the body does not properly use sugar (glucose) that is released from food after digestion.  Take insulin and diabetes medicines as told.  Check your blood sugar every day, as often as told.  Keep all follow-up visits as told by your doctor. This is important. This information is not intended to replace advice given to you by your health care provider. Make sure you discuss any questions you have with your health care provider. Document Revised: 01/10/2019 Document Reviewed: 07/22/2017 Elsevier Patient Education  [employer].

## 2020-04-01 NOTE — Progress Notes (Signed)
Patient presents for vaccination against influenza per orders of Dr. Johnson. Consent given. Counseling provided. No contraindications exists. Vaccine administered without incident.  ° °Luke Van Ausdall, PharmD, CPP °Clinical Pharmacist °[facility] °[phone] ° °

## 2020-05-23 ENCOUNTER — Ambulatory Visit: Payer: Medicaid Other | Admitting: Internal Medicine

## 2020-06-23 ENCOUNTER — Other Ambulatory Visit: Payer: Self-pay | Admitting: Family

## 2020-06-23 DIAGNOSIS — E119 Type 2 diabetes mellitus without complications: Secondary | ICD-10-CM

## 2020-06-26 NOTE — Telephone Encounter (Signed)
Ms. Nils Flack from [facility] called about the status of this refill for the Pt / please advise pt

## 2020-07-01 ENCOUNTER — Encounter: Payer: Self-pay | Admitting: Internal Medicine

## 2020-07-01 ENCOUNTER — Other Ambulatory Visit: Payer: Self-pay

## 2020-07-01 ENCOUNTER — Ambulatory Visit: Payer: Self-pay | Attending: Internal Medicine | Admitting: Internal Medicine

## 2020-07-01 VITALS — BP 98/71 | HR 68 | Resp 16 | Wt 201.2 lb

## 2020-07-01 DIAGNOSIS — E669 Obesity, unspecified: Secondary | ICD-10-CM

## 2020-07-01 DIAGNOSIS — E1169 Type 2 diabetes mellitus with other specified complication: Secondary | ICD-10-CM

## 2020-07-01 DIAGNOSIS — E119 Type 2 diabetes mellitus without complications: Secondary | ICD-10-CM

## 2020-07-01 DIAGNOSIS — E785 Hyperlipidemia, unspecified: Secondary | ICD-10-CM | POA: Insufficient documentation

## 2020-07-01 DIAGNOSIS — Z1231 Encounter for screening mammogram for malignant neoplasm of breast: Secondary | ICD-10-CM

## 2020-07-01 DIAGNOSIS — Z1211 Encounter for screening for malignant neoplasm of colon: Secondary | ICD-10-CM

## 2020-07-01 LAB — GLUCOSE, POCT (MANUAL RESULT ENTRY): POC Glucose: 120 mg/dl — AB (ref 70–99)

## 2020-07-01 NOTE — Progress Notes (Signed)
Patient ID: Monica Mcfarland, female    DOB: 1971/01/07  MRN: [id number]  CC: re-establish and Diabetes   Subjective: Monica Mcfarland is a 50 y.o. female who presents for chronic disease management.  Previous PCP was Dr. Chapman Fitch who is no longer with the practice. Her concerns today include:  DM type II, HL, obesity  DIABETES TYPE 2 Last A1C:   Results for orders placed or performed in visit on 07/01/20  POCT glucose (manual entry)  Result Value Ref Range   POC Glucose 120 (A) 70 - 99 mg/dl    Med Adherence:  [x]  Yes    []  No Medication side effects:  []  Yes    []  No Home Monitoring?  [x]  Yes BID Home glucose results range: before BF 97-120; bedtime BS about same Diet Adherence: [x]  Yes - She has cut out sugary drinks.  Uses Splenda in coffee.  Eating more salads.  She has loss 10 lbs since last visit.  Her goal is to get under 200 lbs Exercise: [x]  Yes  -exercising 3 x a wk at home and goes to [facility] once a wk to do treadmill and bike. Hypoglycemic episodes?: [x]  Yes only once Numbness of the feet? []  Yes    [x]  No. Reports problems cutting the toenails on the big toes because they are too thick. Would like to know what her options are. Retinopathy hx? []  Yes    [x]  No Last eye exam: no blurred vision. Had eye exam at [facility] 2 wks.  Exam was good.  No change in rxn Comments: Had question about coronary CT that she had done in August of last year. She states that it had made mention about some abnormality on the long. I reviewed her chart. It was actually a chest x-ray that she had done in July of last year that mentioned mild basilar opacities which may reflect atelectasis. Denies any chronic cough. She does report some shortness of breath at times when bending over but she attributes this to being overweight.  HM:  Due for MMG, colon CA screen and PAP.  Would like to hold on getting Pap done because she is self pay.    Patient Active Problem List   Diagnosis Date Noted   . Influenza vaccine needed 04/01/2020  . Type 2 diabetes mellitus ([place]) 07/15/2019     Current Outpatient Medications on File Prior to Visit  Medication Sig Dispense Refill  . metFORMIN (GLUCOPHAGE-XR) 500 MG 24 hr tablet TAKE 2 TABLETS BY MOUTH TWICE DAILY WITH A MEAL 120 tablet 2  . rosuvastatin (CRESTOR) 10 MG tablet TAKE 1 TABLET(10 MG) BY MOUTH DAILY 90 tablet 0  . Aspirin 81 MG CAPS Take 4 tablets by mouth daily. For chest pain (Patient not taking: Reported on 07/01/2020)    . Blood Glucose Monitoring Suppl (TRUE METRIX METER) w/Device KIT Use to check blood sugars once per day either fasting/2 hours after a meal or before bedtime (Patient not taking: Reported on 07/01/2020) 1 kit 0  . famotidine (PEPCID) 20 MG tablet Take 1 tablet (20 mg total) by mouth daily. (Patient not taking: Reported on 07/01/2020) 14 tablet 0  . glucose blood (TRUE METRIX BLOOD GLUCOSE TEST) test strip Use as instructed to check blood sugar once per day (Patient not taking: Reported on 07/01/2020) 100 each 3  . TRUEplus Lancets 28G MISC Use once daily when checking blood sugars (Patient not taking: Reported on 07/01/2020) 100 each 3   No current facility-administered  medications on file prior to visit.    Allergies  Allergen Reactions  . 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 Shellfish Allergy Anaphylaxis    Social History   Socioeconomic History  . Marital status: Single    Spouse name: Not on file  . Number of children: Not on file  . Years of education: Not on file  . Highest education level: Not on file  Occupational History  . Not on file  Tobacco Use  . Smoking status: Former Smoker    Types: Cigarettes    Start date: 07/12/2015  . Smokeless tobacco: Never Used  Substance and Sexual Activity  . Alcohol use: Not Currently  . Drug use: Not Currently  . Sexual activity: Not Currently  Other Topics Concern  . Not on file  Social History Narrative  . Not on file    Social Determinants of Health   Financial Resource Strain: Not on file  Food Insecurity: Not on file  Transportation Needs: Not on file  Physical Activity: Not on file  Stress: Not on file  Social Connections: Not on file  Intimate Partner Violence: Not on file    Family History  Problem Relation Age of Onset  . Diabetes Mother   . Heart disease Mother   . Diabetes Father   . Hearing loss Maternal Grandmother   . Hearing loss Maternal Grandfather   . Diabetes Sister     Past Surgical History:  Procedure Laterality Date  . CESAREAN SECTION     3 children     ROS: Review of Systems Negative except as stated above  PHYSICAL EXAM: BP 98/71   Pulse 68   Resp 16   Wt 201 lb 3.2 oz (91.3 kg)   SpO2 95%   BMI 32.47 kg/m   Wt Readings from Last 3 Encounters:  07/01/20 201 lb 3.2 oz (91.3 kg)  04/01/20 211 lb 3.2 oz (95.8 kg)  11/27/19 198 lb (89.8 kg)   Physical Exam  General appearance - alert, well appearing, middle-age Caucasian female and in no distress Mental status - normal mood, behavior, speech, dress, motor activity, and thought processes Neck - supple, no significant adenopathy Chest - clear to auscultation, no wheezes, rales or rhonchi, symmetric air entry Heart - normal rate, regular rhythm, normal S1, S2, no murmurs, rubs, clicks or gallops Extremities - peripheral pulses normal, no pedal edema, no clubbing or cyanosis Diabetic Foot Exam - Simple   Simple Foot Form Visual Inspection See comments: Yes Sensation Testing Intact to touch and monofilament testing bilaterally: Yes Pulse Check Posterior Tibialis and Dorsalis pulse intact bilaterally: Yes Comments Toenails are thick and overgrown.     Depression screen [facility] 2/9 07/01/2020 04/01/2020 07/12/2019  Decreased Interest 0 2 0  Down, Depressed, Hopeless 0 1 0  PHQ - 2 Score 0 3 0  Altered sleeping - 3 0  Tired, decreased energy - 3 0  Change in appetite - 3 0  Feeling bad or failure about  yourself  - 0 0  Trouble concentrating - 0 0  Moving slowly or fidgety/restless - 0 0  Suicidal thoughts - 0 0  PHQ-9 Score - 12 0    CMP Latest Ref Rng & Units 12/27/2019 11/23/2019 11/22/2019  Glucose 65 - 99 mg/dL 92 118(H) 96  BUN 6 - 24 mg/dL 21 13 15   Creatinine 0.57 - 1.00 mg/dL 0.73 0.75 0.67  Sodium 134 - 144 mmol/L 142  139 140  Potassium 3.5 - 5.2 mmol/L 4.8 3.8 3.6  Chloride 96 - 106 mmol/L 103 103 104  CO2 20 - 29 mmol/L 27 28 25   Calcium 8.7 - 10.2 mg/dL 9.5 9.2 8.7(L)  Total Protein 6.0 - 8.5 g/dL - - -  Total Bilirubin 0.0 - 1.2 mg/dL - - -  Alkaline Phos 39 - 117 IU/L - - -  AST 0 - 40 IU/L - - -  ALT 0 - 32 IU/L - - -   Lipid Panel     Component Value Date/Time   CHOL 127 08/06/2019 0915   TRIG 71 08/06/2019 0915   HDL 54 08/06/2019 0915   CHOLHDL 2.4 08/06/2019 0915   LDLCALC 59 08/06/2019 0915    CBC    Component Value Date/Time   WBC 6.6 11/23/2019 1415   RBC 4.30 11/23/2019 1415   HGB 13.1 11/23/2019 1415   HCT 40.1 11/23/2019 1415   PLT 211 11/23/2019 1415   MCV 93.3 11/23/2019 1415   MCH 30.5 11/23/2019 1415   MCHC 32.7 11/23/2019 1415   RDW 11.9 11/23/2019 1415   Depression screen [facility] 2/9 07/01/2020 04/01/2020 07/12/2019  Decreased Interest 0 2 0  Down, Depressed, Hopeless 0 1 0  PHQ - 2 Score 0 3 0  Altered sleeping - 3 0  Tired, decreased energy - 3 0  Change in appetite - 3 0  Feeling bad or failure about yourself  - 0 0  Trouble concentrating - 0 0  Moving slowly or fidgety/restless - 0 0  Suicidal thoughts - 0 0  PHQ-9 Score - 12 0     ASSESSMENT AND PLAN:  1. Type 2 diabetes mellitus with obesity ([place]) Commended her on weight loss, changes in eating habits and regular exercise. Encouraged her to keep up the good works. Continue Metformin. Advised seeing a podiatrist to have toenails clipped. - POCT glucose (manual entry) - Microalbumin / creatinine urine ratio - Hemoglobin A1c  2. Hyperlipidemia associated with type 2  diabetes mellitus (HCC) Continue atorvastatin - Hepatic Function Panel  3. Encounter for screening mammogram for malignant neoplasm of breast We will try to get free mammograms through [facility] program. Gave her the information on phone number to call the [facility] program to try getting in for a Pap which would be free through that program - MM Digital Screening; Future  4. Screening for colon cancer Discussed need for colon cancer screening given her age. Patient willing to be screened. She is uninsured. Willing to do fit test. - Fecal occult blood, imunochemical([facility])  Patient was given the opportunity to ask questions.  Patient verbalized understanding of the plan and was able to repeat key elements of the plan.   Orders Placed This Encounter  Procedures  . Fecal occult blood, imunochemical([facility])  . MM Digital Screening  . Microalbumin / creatinine urine ratio  . Hemoglobin A1c  . Hepatic Function Panel  . POCT glucose (manual entry)     Requested Prescriptions    No prescriptions requested or ordered in this encounter    Return in about 4 months (around 10/31/2020).  Karle Plumber, MD, FACP

## 2020-07-02 LAB — HEPATIC FUNCTION PANEL
ALT: 10 IU/L (ref 0–32)
AST: 18 IU/L (ref 0–40)
Albumin: 4.6 g/dL (ref 3.8–4.8)
Alkaline Phosphatase: 81 IU/L (ref 44–121)
Bilirubin Total: 0.9 mg/dL (ref 0.0–1.2)
Bilirubin, Direct: 0.29 mg/dL (ref 0.00–0.40)
Total Protein: 7.5 g/dL (ref 6.0–8.5)

## 2020-07-02 LAB — MICROALBUMIN / CREATININE URINE RATIO
Creatinine, Urine: 148.5 mg/dL
Microalb/Creat Ratio: 10 mg/g creat (ref 0–29)
Microalbumin, Urine: 15.2 ug/mL

## 2020-07-02 LAB — HEMOGLOBIN A1C
Est. average glucose Bld gHb Est-mCnc: 123 mg/dL
Hgb A1c MFr Bld: 5.9 % — ABNORMAL HIGH (ref 4.8–5.6)

## 2020-07-02 NOTE — Progress Notes (Signed)
A1c is 5.9 meaning that her diabetes is well controlled.  Liver function tests normal.  Await the results of urine which will evaluate for any abnormal amounts of protein in the urine.  I will let her know if this comes back abnormal.

## 2020-08-15 ENCOUNTER — Other Ambulatory Visit: Payer: Self-pay | Admitting: Family

## 2020-08-15 DIAGNOSIS — E119 Type 2 diabetes mellitus without complications: Secondary | ICD-10-CM

## 2020-09-24 ENCOUNTER — Other Ambulatory Visit: Payer: Self-pay | Admitting: Internal Medicine

## 2020-09-24 DIAGNOSIS — E119 Type 2 diabetes mellitus without complications: Secondary | ICD-10-CM

## 2020-09-24 NOTE — Telephone Encounter (Signed)
Requested Prescriptions  Pending Prescriptions Disp Refills  . metFORMIN (GLUCOPHAGE-XR) 500 MG 24 hr tablet [Pharmacy Med Name: METFORMIN ER 500MG 24HR TABS] 120 tablet 2    Sig: TAKE 2 TABLETS BY MOUTH TWICE DAILY WITH A MEAL     Endocrinology:  Diabetes - Biguanides Passed - 09/24/2020  3:40 AM      Passed - Cr in normal range and within 360 days    Creatinine, Ser  Date Value Ref Range Status  12/27/2019 0.73 0.57 - 1.00 mg/dL Final         Passed - HBA1C is between 0 and 7.9 and within 180 days    HbA1c, POC (prediabetic range)  Date Value Ref Range Status  04/01/2020 6.1 5.7 - 6.4 % Final   Hgb A1c MFr Bld  Date Value Ref Range Status  07/01/2020 5.9 (H) 4.8 - 5.6 % Final    Comment:             Prediabetes: 5.7 - 6.4          Diabetes: >6.4          Glycemic control for adults with diabetes: <7.0          Passed - eGFR in normal range and within 360 days    GFR calc Af Amer  Date Value Ref Range Status  12/27/2019 112 >59 mL/min/1.73 Final    Comment:    **[facility] currently reports eGFR in compliance with the current**   recommendations of the [employer]. [facility] will   update reporting as new guidelines are published from the NKF-ASN   Task force.    GFR calc non Af Amer  Date Value Ref Range Status  12/27/2019 97 >59 mL/min/1.73 Final         Passed - Valid encounter within last 6 months    Recent Outpatient Visits          2 months ago Type 2 diabetes mellitus with obesity ([place])   [place] Ladell [place], MD   5 months ago Need for influenza vaccination   [place], Jarome Matin, RPH-CPP   5 months ago Encounter to establish care   [place], [place] J, NP   10 months ago New onset type 2 diabetes mellitus [facility])   [place], [place], NP   1 year ago Need for vaccination for Strep  pneumoniae   [place], RPH-CPP      Future Appointments            In 1 month Wynetta Emery Dalbert Batman, MD [place]

## 2020-10-30 ENCOUNTER — Ambulatory Visit: Payer: Self-pay | Attending: Internal Medicine | Admitting: Internal Medicine

## 2020-10-30 ENCOUNTER — Other Ambulatory Visit: Payer: Self-pay

## 2020-10-30 VITALS — BP 96/67 | HR 67 | Resp 16 | Wt 198.0 lb

## 2020-10-30 DIAGNOSIS — Z1211 Encounter for screening for malignant neoplasm of colon: Secondary | ICD-10-CM

## 2020-10-30 DIAGNOSIS — Z1159 Encounter for screening for other viral diseases: Secondary | ICD-10-CM

## 2020-10-30 DIAGNOSIS — Z23 Encounter for immunization: Secondary | ICD-10-CM

## 2020-10-30 DIAGNOSIS — E669 Obesity, unspecified: Secondary | ICD-10-CM

## 2020-10-30 DIAGNOSIS — Z1231 Encounter for screening mammogram for malignant neoplasm of breast: Secondary | ICD-10-CM

## 2020-10-30 DIAGNOSIS — E1169 Type 2 diabetes mellitus with other specified complication: Secondary | ICD-10-CM

## 2020-10-30 DIAGNOSIS — Z114 Encounter for screening for human immunodeficiency virus [HIV]: Secondary | ICD-10-CM

## 2020-10-30 LAB — GLUCOSE, POCT (MANUAL RESULT ENTRY): POC Glucose: 123 mg/dl — AB (ref 70–99)

## 2020-10-30 NOTE — Progress Notes (Signed)
Pt is wanting to know how long does she has to be on the medications

## 2020-10-30 NOTE — Progress Notes (Signed)
Patient ID: Monica Mcfarland, female    DOB: 1971/03/25  MRN: [id number]  CC: Diabetes   Subjective: [place] Monica Mcfarland is a 50 y.o. female who presents for chronic ds management Her concerns today include:  DM type II, HL, obesity  DM/Obesity:  BS 80-90s a.m and at bedtime 100-120.  BS in 70s twice.  She can tell when BS low.  Tolerating Metformin She has lost 13 pounds since November of last year.  She reports improvement on her eating habits.  She uses now wheat bread, has cut back on eating pasta and red meat.  She drinks sodas occasionally.  She is doing more walking at least about twice a week along with some sit ups and push-ups. Blood pressure noted to be low today.  She denies any dizziness.  She drinks adequate fluids during the day.  HM:  due for pap but wants to hold off due to cost and being uninsured..  Given FIT on last visit.  She has not done it as yet.  Referred to [facility] program for MMG but has not be called as yet.  Would like to hold on getting Shingrix vaccine.  She has had 2 of [place] vaccines.  Agreeable to hepatitis C and HIV screening today. Patient Active Problem List   Diagnosis Date Noted   Hyperlipidemia associated with type 2 diabetes mellitus ([place]) 07/01/2020   Influenza vaccine needed 04/01/2020   Type 2 diabetes mellitus with obesity ([place]) 07/15/2019     Current Outpatient Medications on File Prior to Visit  Medication Sig Dispense Refill   metFORMIN (GLUCOPHAGE-XR) 500 MG 24 hr tablet TAKE 2 TABLETS BY MOUTH TWICE DAILY WITH A MEAL 120 tablet 2   rosuvastatin (CRESTOR) 10 MG tablet TAKE 1 TABLET(10 MG) BY MOUTH DAILY 90 tablet 0   Aspirin 81 MG CAPS Take 4 tablets by mouth daily. For chest pain (Patient not taking: Reported on 07/01/2020)     Blood Glucose Monitoring Suppl (TRUE METRIX METER) w/Device KIT Use to check blood sugars once per day either fasting/2 hours after a meal or before bedtime (Patient not taking: Reported on 07/01/2020) 1 kit 0    famotidine (PEPCID) 20 MG tablet Take 1 tablet (20 mg total) by mouth daily. (Patient not taking: Reported on 07/01/2020) 14 tablet 0   glucose blood (TRUE METRIX BLOOD GLUCOSE TEST) test strip Use as instructed to check blood sugar once per day (Patient not taking: Reported on 07/01/2020) 100 each 3   TRUEplus Lancets 28G MISC Use once daily when checking blood sugars (Patient not taking: Reported on 07/01/2020) 100 each 3   No current facility-administered medications on file prior to visit.    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, rash, hives   Shellfish Allergy Anaphylaxis    Social History   Socioeconomic History   Marital status: Single    Spouse name: Not on file   Number of children: Not on file   Years of education: Not on file   Highest education level: Not on file  Occupational History   Not on file  Tobacco Use   Smoking status: Former    Pack years: 0.00    Types: Cigarettes    Start date: 07/12/2015   Smokeless tobacco: Never  Substance and Sexual Activity   Alcohol use: Not Currently   Drug use: Not Currently   Sexual activity: Not Currently  Other Topics Concern  Not on file  Social History Narrative   Not on file   Social Determinants of Health   Financial Resource Strain: Not on file  Food Insecurity: Not on file  Transportation Needs: Not on file  Physical Activity: Not on file  Stress: Not on file  Social Connections: Not on file  Intimate Partner Violence: Not on file    Family History  Problem Relation Age of Onset   Diabetes Mother    Heart disease Mother    Diabetes Father    Hearing loss Maternal Grandmother    Hearing loss Maternal Grandfather    Diabetes Sister     Past Surgical History:  Procedure Laterality Date   CESAREAN SECTION     3 children     ROS: Review of Systems Negative except as stated above  PHYSICAL EXAM: BP 96/67   Pulse 67   Resp 16   Wt 198 lb (89.8  kg)   SpO2 97%   BMI 31.96 kg/m   Wt Readings from Last 3 Encounters:  10/30/20 198 lb (89.8 kg)  07/01/20 201 lb 3.2 oz (91.3 kg)  04/01/20 211 lb 3.2 oz (95.8 kg)  Repeat blood pressure today 96/68.  Physical Exam  General appearance - alert, well appearing, and in no distress Mental status - normal mood, behavior, speech, dress, motor activity, and thought processes Neck - supple, no significant adenopathy Chest - clear to auscultation, no wheezes, rales or rhonchi, symmetric air entry Heart - normal rate, regular rhythm, normal S1, S2, no murmurs, rubs, clicks or gallops Extremities - peripheral pulses normal, no pedal edema, no clubbing or cyanosis   CMP Latest Ref Rng & Units 07/01/2020 12/27/2019 11/23/2019  Glucose 65 - 99 mg/dL - 92 118(H)  BUN 6 - 24 mg/dL - 21 13  Creatinine 0.57 - 1.00 mg/dL - 0.73 0.75  Sodium 134 - 144 mmol/L - 142 139  Potassium 3.5 - 5.2 mmol/L - 4.8 3.8  Chloride 96 - 106 mmol/L - 103 103  CO2 20 - 29 mmol/L - 27 28  Calcium 8.7 - 10.2 mg/dL - 9.5 9.2  Total Protein 6.0 - 8.5 g/dL 7.5 - -  Total Bilirubin 0.0 - 1.2 mg/dL 0.9 - -  Alkaline Phos 44 - 121 IU/L 81 - -  AST 0 - 40 IU/L 18 - -  ALT 0 - 32 IU/L 10 - -   Lipid Panel     Component Value Date/Time   CHOL 127 08/06/2019 0915   TRIG 71 08/06/2019 0915   HDL 54 08/06/2019 0915   CHOLHDL 2.4 08/06/2019 0915   LDLCALC 59 08/06/2019 0915    CBC    Component Value Date/Time   WBC 6.6 11/23/2019 1415   RBC 4.30 11/23/2019 1415   HGB 13.1 11/23/2019 1415   HCT 40.1 11/23/2019 1415   PLT 211 11/23/2019 1415   MCV 93.3 11/23/2019 1415   MCH 30.5 11/23/2019 1415   MCHC 32.7 11/23/2019 1415   RDW 11.9 11/23/2019 1415    ASSESSMENT AND PLAN: 1. Type 2 diabetes mellitus with obesity (HCC) Blood sugars are at goal.  I have commended her on changes that she is made in her eating habits and trying to exercise more.  Encouraged to keep up the good work.  Continue metformin. - POCT  glucose (manual entry)  2. Screening for HIV (human immunodeficiency virus) Patient agreeable to screening. - HIV antibody (with reflex)  3. Need for hepatitis C screening test - Hepatitis C  Antibody  4. COVID-19 vaccine series started Encouraged her to get her booster shot if it has been 6 months since her second vaccine shot.  5. Encounter for screening mammogram for malignant neoplasm of breast Given the phone number to call [facility] program to follow-up on her scholarship application.  6. Screening for colon cancer Encourage patient to use the fit test and bring it to the laboratory.  Patient declines shingles vaccine. She wants to hold off on doing the Pap smear due to cost.  I have advised her to apply for the [employer] card/[facility] discount card and hopefully we would be able to do her Pap on next visit.      Patient was given the opportunity to ask questions.  Patient verbalized understanding of the plan and was able to repeat key elements of the plan.   Orders Placed This Encounter  Procedures   POCT glucose (manual entry)     Requested Prescriptions    No prescriptions requested or ordered in this encounter    No follow-ups on file.  Karle Plumber, MD, FACP

## 2020-10-30 NOTE — Patient Instructions (Signed)
Please complete forms for [employer] card/[facility] discount card. Please remember to turn in stool kit test.

## 2020-10-31 ENCOUNTER — Telehealth: Payer: Self-pay

## 2020-10-31 LAB — HIV ANTIBODY (ROUTINE TESTING W REFLEX): HIV Screen 4th Generation wRfx: NONREACTIVE

## 2020-10-31 LAB — HEPATITIS C ANTIBODY: Hep C Virus Ab: <0.1 s/co ratio (ref 0.0–0.9)

## 2020-10-31 NOTE — Telephone Encounter (Signed)
Contacted pt to go over lab results pt didn't answer lvm   Sent a CRM and forward labs to NT to give pt labs when they call back   

## 2020-11-22 ENCOUNTER — Other Ambulatory Visit: Payer: Self-pay | Admitting: Internal Medicine

## 2020-11-22 DIAGNOSIS — E119 Type 2 diabetes mellitus without complications: Secondary | ICD-10-CM

## 2020-11-22 NOTE — Telephone Encounter (Signed)
Requested medication (s) are due for refill today: yes  Requested medication (s) are on the active medication list: yes  Last refill:  08/18/20 #90  Future visit scheduled: yes   Notes to clinic:  needs labs   Requested Prescriptions  Pending Prescriptions Disp Refills   rosuvastatin (CRESTOR) 10 MG tablet [Pharmacy Med Name: ROSUVASTATIN 10MG  TABLETS] 90 tablet 0    Sig: TAKE 1 TABLET(10 MG) BY MOUTH DAILY      Cardiovascular:  Antilipid - Statins Failed - 11/22/2020  7:14 AM      Failed - Total Cholesterol in normal range and within 360 days    Cholesterol, Total  Date Value Ref Range Status  08/06/2019 127 100 - 199 mg/dL Final          Failed - LDL in normal range and within 360 days    LDL Chol Calc (NIH)  Date Value Ref Range Status  08/06/2019 59 0 - 99 mg/dL Final          Failed - HDL in normal range and within 360 days    HDL  Date Value Ref Range Status  08/06/2019 54 >39 mg/dL Final          Failed - Triglycerides in normal range and within 360 days    Triglycerides  Date Value Ref Range Status  08/06/2019 71 0 - 149 mg/dL Final          Passed - Patient is not pregnant      Passed - Valid encounter within last 12 months    Recent Outpatient Visits           3 weeks ago Type 2 diabetes mellitus with obesity (HCC)   [facility] 10/06/2019 B, MD   4 months ago Type 2 diabetes mellitus with obesity (HCC)   [facility] Jonah Blue, MD   7 months ago Need for influenza vaccination   [facility], Lois [place]ux[place], RPH-CPP   7 months ago Encounter to establish care   [facility] [place], [place], NP   1 year ago New onset type 2 diabetes mellitus [facility])   [facility], NP       Future Appointments             In 3 months Rema Fendt, Laural Benes, MD [facility]

## 2021-03-02 ENCOUNTER — Other Ambulatory Visit: Payer: Self-pay

## 2021-03-02 ENCOUNTER — Encounter: Payer: Self-pay | Admitting: Internal Medicine

## 2021-03-02 ENCOUNTER — Ambulatory Visit: Payer: Self-pay | Attending: Internal Medicine | Admitting: Internal Medicine

## 2021-03-02 VITALS — BP 113/72 | HR 67 | Resp 16 | Wt 208.0 lb

## 2021-03-02 DIAGNOSIS — E1169 Type 2 diabetes mellitus with other specified complication: Secondary | ICD-10-CM

## 2021-03-02 DIAGNOSIS — Z1231 Encounter for screening mammogram for malignant neoplasm of breast: Secondary | ICD-10-CM

## 2021-03-02 DIAGNOSIS — Z1211 Encounter for screening for malignant neoplasm of colon: Secondary | ICD-10-CM

## 2021-03-02 DIAGNOSIS — E669 Obesity, unspecified: Secondary | ICD-10-CM

## 2021-03-02 DIAGNOSIS — E785 Hyperlipidemia, unspecified: Secondary | ICD-10-CM

## 2021-03-02 DIAGNOSIS — Z2821 Immunization not carried out because of patient refusal: Secondary | ICD-10-CM

## 2021-03-02 LAB — POCT GLYCOSYLATED HEMOGLOBIN (HGB A1C): HbA1c, POC (controlled diabetic range): 6 % (ref 0.0–7.0)

## 2021-03-02 NOTE — Progress Notes (Signed)
Patient ID: Monica Mcfarland, female    DOB: 02/26/1971  MRN: [id number]  CC: Diabetes   Subjective: Monica Mcfarland is a 50 y.o. female who presents for chronic ds management Her concerns today include:  DM type II, HL, obesity  HM: Have not heard back from [facility] for MMG.  No PAP.  Misplaced the FIT kit, requesting new one today. Declines flu and pneumonia vaccines  DIABETES TYPE 2/Obesity Last A1C:   Results for orders placed or performed in visit on 03/02/21  POCT glycosylated hemoglobin (Hb A1C)  Result Value Ref Range   Hemoglobin A1C     HbA1c POC (<> result, manual entry)     HbA1c, POC (prediabetic range)     HbA1c, POC (controlled diabetic range) 6.0 0.0 - 7.0 %    Med Adherence:  [x]  Yes -Metformin    []  No Medication side effects:  []  Yes    [x]  No Home Monitoring?  [x]  Yes twice a day   []  No Home glucose results range:85-109 Diet Adherence: [x]  Yes - doing good.      Exercise: []  Yes    [x]  No.  Wgh up 10 lbs Hypoglycemic episodes?: []  Yes    [x]  No Numbness of the feet? []  Yes    [x]  No Retinopathy hx? []  Yes    []  No Last eye exam:  up to date Comments:   HL:  taking and tolerating Crestor   Patient Active Problem List   Diagnosis Date Noted   Hyperlipidemia associated with type 2 diabetes mellitus ([place]) 07/01/2020   Influenza vaccine needed 04/01/2020   Type 2 diabetes mellitus with obesity ([place]) 07/15/2019     Current Outpatient Medications on File Prior to Visit  Medication Sig Dispense Refill   metFORMIN (GLUCOPHAGE-XR) 500 MG 24 hr tablet TAKE 2 TABLETS BY MOUTH TWICE DAILY WITH A MEAL 120 tablet 2   rosuvastatin (CRESTOR) 10 MG tablet TAKE 1 TABLET(10 MG) BY MOUTH DAILY 90 tablet 1   Aspirin 81 MG CAPS Take 4 tablets by mouth daily. For chest pain (Patient not taking: Reported on 07/01/2020)     Blood Glucose Monitoring Suppl (TRUE METRIX METER) w/Device KIT Use to check blood sugars once per day either fasting/2 hours after a meal or before  bedtime (Patient not taking: Reported on 07/01/2020) 1 kit 0   glucose blood (TRUE METRIX BLOOD GLUCOSE TEST) test strip Use as instructed to check blood sugar once per day (Patient not taking: Reported on 07/01/2020) 100 each 3   TRUEplus Lancets 28G MISC Use once daily when checking blood sugars (Patient not taking: Reported on 07/01/2020) 100 each 3   No current facility-administered medications on file prior to visit.    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, rash, hives   Shellfish Allergy Anaphylaxis    Social History   Socioeconomic History   Marital status: Single    Spouse name: Not on file   Number of children: Not on file   Years of education: Not on file   Highest education level: Not on file  Occupational History   Not on file  Tobacco Use   Smoking status: Former    Types: Cigarettes    Start date: 07/12/2015   Smokeless tobacco: Never  Substance and Sexual Activity   Alcohol use: Not Currently   Drug use: Not Currently   Sexual activity: Not Currently  Other Topics Concern  Not on file  Social History Narrative   Not on file   Social Determinants of Health   Financial Resource Strain: Not on file  Food Insecurity: Not on file  Transportation Needs: Not on file  Physical Activity: Not on file  Stress: Not on file  Social Connections: Not on file  Intimate Partner Violence: Not on file    Family History  Problem Relation Age of Onset   Diabetes Mother    Heart disease Mother    Diabetes Father    Hearing loss Maternal Grandmother    Hearing loss Maternal Grandfather    Diabetes Sister     Past Surgical History:  Procedure Laterality Date   CESAREAN SECTION     3 children     ROS: Review of Systems Negative except as stated above  PHYSICAL EXAM: BP 113/72   Pulse 67   Resp 16   Wt 208 lb (94.3 kg)   SpO2 97%   BMI 33.57 kg/m   Wt Readings from Last 3 Encounters:  03/02/21 208 lb  (94.3 kg)  10/30/20 198 lb (89.8 kg)  07/01/20 201 lb 3.2 oz (91.3 kg)    Physical Exam  General appearance - alert, well appearing, and in no distress Mental status - normal mood, behavior, speech, dress, motor activity, and thought processes Chest - clear to auscultation, no wheezes, rales or rhonchi, symmetric air entry Heart - normal rate, regular rhythm, normal S1, S2, no murmurs, rubs, clicks or gallops Extremities - peripheral pulses normal, no pedal edema, no clubbing or cyanosis Diabetic Foot Exam - Simple   Simple Foot Form Visual Inspection No deformities, no ulcerations, no other skin breakdown bilaterally: Yes Sensation Testing Intact to touch and monofilament testing bilaterally: Yes Pulse Check Posterior Tibialis and Dorsalis pulse intact bilaterally: Yes Comments      CMP Latest Ref Rng & Units 07/01/2020 12/27/2019 11/23/2019  Glucose 65 - 99 mg/dL - 92 118(H)  BUN 6 - 24 mg/dL - 21 13  Creatinine 0.57 - 1.00 mg/dL - 0.73 0.75  Sodium 134 - 144 mmol/L - 142 139  Potassium 3.5 - 5.2 mmol/L - 4.8 3.8  Chloride 96 - 106 mmol/L - 103 103  CO2 20 - 29 mmol/L - 27 28  Calcium 8.7 - 10.2 mg/dL - 9.5 9.2  Total Protein 6.0 - 8.5 g/dL 7.5 - -  Total Bilirubin 0.0 - 1.2 mg/dL 0.9 - -  Alkaline Phos 44 - 121 IU/L 81 - -  AST 0 - 40 IU/L 18 - -  ALT 0 - 32 IU/L 10 - -   Lipid Panel     Component Value Date/Time   CHOL 127 08/06/2019 0915   TRIG 71 08/06/2019 0915   HDL 54 08/06/2019 0915   CHOLHDL 2.4 08/06/2019 0915   LDLCALC 59 08/06/2019 0915    CBC    Component Value Date/Time   WBC 6.6 11/23/2019 1415   RBC 4.30 11/23/2019 1415   HGB 13.1 11/23/2019 1415   HCT 40.1 11/23/2019 1415   PLT 211 11/23/2019 1415   MCV 93.3 11/23/2019 1415   MCH 30.5 11/23/2019 1415   MCHC 32.7 11/23/2019 1415   RDW 11.9 11/23/2019 1415    ASSESSMENT AND PLAN: 1. Type 2 diabetes mellitus with obesity (HCC) At goal.  Continue metformin and healthy eating habits.   Discussed and encourage regular exercise.  Discussed exercise that she can do at home - POCT glycosylated hemoglobin (Hb A1C) - CBC - Basic metabolic  panel  2. Hyperlipidemia associated with type 2 diabetes mellitus (HCC) Last LDL was at goal.  Continue Crestor - Lipid panel  3. Screening for colon cancer I will have the lab give her a new kit - Fecal occult blood, imunochemical([facility])  4. Influenza vaccination declined   5. Encounter for screening mammogram for malignant neoplasm of breast Encourage patient to call [facility] to f/u on application for MMG and also request to be scheduled for PAP.     Patient was given the opportunity to ask questions.  Patient verbalized understanding of the plan and was able to repeat key elements of the plan.   Orders Placed This Encounter  Procedures   Fecal occult blood, imunochemical([facility]/Sun[facility])   CBC   Basic metabolic panel   Lipid panel   POCT glycosylated hemoglobin (Hb A1C)     Requested Prescriptions    No prescriptions requested or ordered in this encounter    Return in about 4 months (around 06/30/2021).  Karle Plumber, MD, FACP

## 2021-03-02 NOTE — Patient Instructions (Addendum)
Please call the [facility] program to get a free mammogram and pap smear.    Diabetes Mellitus and Exercise Exercising regularly is important for overall health, especially for people who have diabetes mellitus. Exercising is not only about losing weight. It has many other health benefits, such as increasing muscle strength and bone density and reducing body fat and stress. This leads to improved fitness, flexibility, and endurance, all of which result in better overall health. What are the benefits of exercise if I have diabetes? Exercise has many benefits for people with diabetes. They include: Helping to lower and control blood sugar (glucose). Helping the body to respond better to the hormone insulin by improving insulin sensitivity. Reducing how much insulin the body needs. Lowering the risk for heart disease by: Lowering "bad" cholesterol and triglyceride levels. Increasing "good" cholesterol levels. Lowering blood pressure. Lowering blood glucose levels. What is my activity plan? Your health care provider or certified diabetes educator can help you make a plan for the type and frequency of exercise that works for you. This is called your activity plan. Be sure to: Get at least 150 minutes of medium-intensity or high-intensity exercise each week. Exercises may include brisk walking, biking, or water aerobics. Do stretching and strengthening exercises, such as yoga or weight lifting, at least 2 times a week. Spread out your activity over at least 3 days of the week. Get some form of physical activity each day. Do not go more than 2 days in a row without some kind of physical activity. Avoid being inactive for more than 90 minutes at a time. Take frequent breaks to walk or stretch. Choose exercises or activities that you enjoy. Set realistic goals. Start slowly and gradually increase your exercise intensity over time. How do I manage my diabetes during exercise? Monitor your blood  glucose Check your blood glucose before and after exercising. If your blood glucose is: 240 mg/dL (82.5 mmol/L) or higher before you exercise, check your urine for ketones. These are chemicals created by the liver. If you have ketones in your urine, do not exercise until your blood glucose returns to normal. 100 mg/dL (5.6 mmol/L) or lower, eat a snack containing 15-20 grams of carbohydrate. Check your blood glucose 15 minutes after the snack to make sure that your glucose level is above 100 mg/dL (5.6 mmol/L) before you start your exercise. Know the symptoms of low blood glucose (hypoglycemia) and how to treat it. Your risk for hypoglycemia increases during and after exercise. Follow these tips and your health care provider's instructions Keep a carbohydrate snack that is fast-acting for use before, during, and after exercise to help prevent or treat hypoglycemia. Avoid injecting insulin into areas of the body that are going to be exercised. For example, avoid injecting insulin into: Your arms, when you are about to play tennis. Your legs, when you are about to go jogging. Keep records of your exercise habits. Doing this can help you and your health care provider adjust your diabetes management plan as needed. Write down: Food that you eat before and after you exercise. Blood glucose levels before and after you exercise. The type and amount of exercise you have done. Work with your health care provider when you start a new exercise or activity. He or she may need to: Make sure that the activity is safe for you. Adjust your insulin, other medicines, and food that you eat. Drink plenty of water while you exercise. This prevents loss of water (dehydration) and problems  caused by a lot of heat in the body (heat stroke). Where to find more information American Diabetes Association: www.diabetes.org Summary Exercising regularly is important for overall health, especially for people who have diabetes  mellitus. Exercising has many health benefits. It increases muscle strength and bone density and reduces body fat and stress. It also lowers and controls blood glucose. Your health care provider or certified diabetes educator can help you make an activity plan for the type and frequency of exercise that works for you. Work with your health care provider to make sure any new activity is safe for you. Also work with your health care provider to adjust your insulin, other medicines, and the food you eat. This information is not intended to replace advice given to you by your health care provider. Make sure you discuss any questions you have with your health care provider. Document Revised: 01/15/2019 Document Reviewed: 01/15/2019 Elsevier Patient Education  2022 [employer].

## 2021-03-03 ENCOUNTER — Telehealth: Payer: Self-pay

## 2021-03-03 LAB — CBC
Hematocrit: 38.6 % (ref 34.0–46.6)
Hemoglobin: 13.2 g/dL (ref 11.1–15.9)
MCH: 30.6 pg (ref 26.6–33.0)
MCHC: 34.2 g/dL (ref 31.5–35.7)
MCV: 90 fL (ref 79–97)
Platelets: 199 10*3/uL (ref 150–450)
RBC: 4.31 x10E6/uL (ref 3.77–5.28)
RDW: 11.9 % (ref 11.7–15.4)
WBC: 6.6 10*3/uL (ref 3.4–10.8)

## 2021-03-03 LAB — BASIC METABOLIC PANEL
BUN/Creatinine Ratio: 16 (ref 9–23)
BUN: 12 mg/dL (ref 6–24)
CO2: 26 mmol/L (ref 20–29)
Calcium: 9.5 mg/dL (ref 8.7–10.2)
Chloride: 102 mmol/L (ref 96–106)
Creatinine, Ser: 0.76 mg/dL (ref 0.57–1.00)
Glucose: 111 mg/dL — ABNORMAL HIGH (ref 70–99)
Potassium: 4.8 mmol/L (ref 3.5–5.2)
Sodium: 142 mmol/L (ref 134–144)
eGFR: 95 mL/min/{1.73_m2} (ref >59)

## 2021-03-03 LAB — LIPID PANEL
Chol/HDL Ratio: 1.9 ratio (ref 0.0–4.4)
Cholesterol, Total: 130 mg/dL (ref 100–199)
HDL: 69 mg/dL
LDL Chol Calc (NIH): 43 mg/dL (ref 0–99)
Triglycerides: 100 mg/dL (ref 0–149)
VLDL Cholesterol Cal: 18 mg/dL (ref 5–40)

## 2021-03-03 NOTE — Telephone Encounter (Signed)
Contacted pt to go over lab results pt didn't answer lvm   Sent a CRM and forward labs to NT to give pt labs when they call back   

## 2021-03-03 NOTE — Progress Notes (Signed)
Let patient know that her blood cell counts including red blood cells, white blood cells and platelet counts are normal.  Cholesterol level normal and at goal.  Kidney function normal.

## 2021-03-28 ENCOUNTER — Other Ambulatory Visit: Payer: Self-pay | Admitting: Family

## 2021-03-28 DIAGNOSIS — E119 Type 2 diabetes mellitus without complications: Secondary | ICD-10-CM

## 2021-04-26 ENCOUNTER — Other Ambulatory Visit: Payer: Self-pay | Admitting: Internal Medicine

## 2021-04-26 DIAGNOSIS — E119 Type 2 diabetes mellitus without complications: Secondary | ICD-10-CM

## 2021-05-09 ENCOUNTER — Other Ambulatory Visit: Payer: Self-pay | Admitting: Internal Medicine

## 2021-05-09 DIAGNOSIS — E119 Type 2 diabetes mellitus without complications: Secondary | ICD-10-CM

## 2021-05-09 NOTE — Telephone Encounter (Signed)
Requested medication (s) are due for refill today: no  Requested medication (s) are on the active medication list: yes  Last refill:  03/30/21 #120 2 RF  Future visit scheduled: yes  Notes to clinic:  should have enough to last until after upcoming appt in February Called pharmacy on record and they do have med on active profile   Requested Prescriptions  Pending Prescriptions Disp Refills   metFORMIN (GLUCOPHAGE-XR) 500 MG 24 hr tablet [Pharmacy Med Name: METFORMIN ER 500MG 24HR TABS] 120 tablet 2    Sig: TAKE 2 TABLETS BY [place] A MEAL     Endocrinology:  Diabetes - Biguanides Passed - 05/09/2021  8:49 AM      Passed - Cr in normal range and within 360 days    Creatinine, Ser  Date Value Ref Range Status  03/02/2021 0.76 0.57 - 1.00 mg/dL Final          Passed - HBA1C is between 0 and 7.9 and within 180 days    HbA1c, POC (prediabetic range)  Date Value Ref Range Status  04/01/2020 6.1 5.7 - 6.4 % Final   HbA1c, POC (controlled diabetic range)  Date Value Ref Range Status  03/02/2021 6.0 0.0 - 7.0 % Final          Passed - eGFR in normal range and within 360 days    GFR calc Af Amer  Date Value Ref Range Status  12/27/2019 112 >59 mL/min/1.73 Final    Comment:    **[facility] currently reports eGFR in compliance with the current**   recommendations of the [employer]. [facility] will   update reporting as new guidelines are published from the NKF-ASN   Task force.    GFR calc non Af Amer  Date Value Ref Range Status  12/27/2019 97 >59 mL/min/1.73 Final   eGFR  Date Value Ref Range Status  03/02/2021 95 >59 mL/min/1.73 Final          Passed - Valid encounter within last 6 months    Recent Outpatient Visits           2 months ago Type 2 diabetes mellitus with obesity ([place])   [place] Karle Plumber B, MD   6 months ago Type 2 diabetes mellitus with obesity [facility])   [place], MD   10 months ago Type 2 diabetes mellitus with obesity [facility])   [place] Ladell Pier, MD   1 year ago Need for influenza vaccination   [place], RPH-CPP   1 year ago Encounter to establish care   [place], [place], NP       Future Appointments             In 1 month Wynetta Emery Dalbert Batman, MD [place]

## 2021-05-11 ENCOUNTER — Other Ambulatory Visit: Payer: Self-pay | Admitting: Internal Medicine

## 2021-05-11 DIAGNOSIS — E119 Type 2 diabetes mellitus without complications: Secondary | ICD-10-CM

## 2021-05-11 NOTE — Telephone Encounter (Signed)
Pt has been out since christmas, seeking urgent refill.

## 2021-05-11 NOTE — Telephone Encounter (Signed)
Requested medications are due for refill today.  Yes. Per pt she has been out of her medication since Christmas  Requested medications are on the active medications list.  Yes.  Last refill. 03/30/2021  Future visit scheduled.   yes  Notes to clinic.  We refused Rx refill 1 week ago as it appeared that pt had a recent refill. Called pt regarding refill. Pt states that she did get a refill on 11/28/ 2022 but it was just a 1 month supply with no refills. Last refill signed by Dr. Redmond Pulling.     Requested Prescriptions  Pending Prescriptions Disp Refills   metFORMIN (GLUCOPHAGE-XR) 500 MG 24 hr tablet [Pharmacy Med Name: METFORMIN ER 500MG 24HR TABS] 120 tablet 2    Sig: TAKE 2 TABLETS BY MOUTH TWICE DAILY WITH A MEAL     Endocrinology:  Diabetes - Biguanides Passed - 05/11/2021 11:37 AM      Passed - Cr in normal range and within 360 days    Creatinine, Ser  Date Value Ref Range Status  03/02/2021 0.76 0.57 - 1.00 mg/dL Final          Passed - HBA1C is between 0 and 7.9 and within 180 days    HbA1c, POC (prediabetic range)  Date Value Ref Range Status  04/01/2020 6.1 5.7 - 6.4 % Final   HbA1c, POC (controlled diabetic range)  Date Value Ref Range Status  03/02/2021 6.0 0.0 - 7.0 % Final          Passed - eGFR in normal range and within 360 days    GFR calc Af Amer  Date Value Ref Range Status  12/27/2019 112 >59 mL/min/1.73 Final    Comment:    **[facility] currently reports eGFR in compliance with the current**   recommendations of the [employer]. [facility] will   update reporting as new guidelines are published from the NKF-ASN   Task force.    GFR calc non Af Amer  Date Value Ref Range Status  12/27/2019 97 >59 mL/min/1.73 Final   eGFR  Date Value Ref Range Status  03/02/2021 95 >59 mL/min/1.73 Final          Passed - Valid encounter within last 6 months    Recent Outpatient Visits           2 months ago Type 2 diabetes mellitus with obesity ([place])    [place] Karle Plumber B, MD   6 months ago Type 2 diabetes mellitus with obesity [facility])   [place], MD   10 months ago Type 2 diabetes mellitus with obesity [facility])   [place] Ladell Pier, MD   1 year ago Need for influenza vaccination   [place], RPH-CPP   1 year ago Encounter to establish care   [place], [place], NP       Future Appointments             In 1 month Wynetta Emery Dalbert Batman, MD [place]

## 2021-05-11 NOTE — Telephone Encounter (Signed)
Called pt regarding refill. Pt states that she did get a refill on 11/28/ 2022 but it was just a 1 month supply with no refills.

## 2021-05-12 NOTE — Telephone Encounter (Signed)
Requested Prescriptions  Pending Prescriptions Disp Refills   metFORMIN (GLUCOPHAGE-XR) 500 MG 24 hr tablet [Pharmacy Med Name: METFORMIN ER 500MG 24HR TABS] 360 tablet     Sig: TAKE 2 TABLETS BY MOUTH TWICE DAILY WITH A MEAL     Endocrinology:  Diabetes - Biguanides Passed - 05/11/2021  3:51 PM      Passed - Cr in normal range and within 360 days    Creatinine, Ser  Date Value Ref Range Status  03/02/2021 0.76 0.57 - 1.00 mg/dL Final         Passed - HBA1C is between 0 and 7.9 and within 180 days    HbA1c, POC (prediabetic range)  Date Value Ref Range Status  04/01/2020 6.1 5.7 - 6.4 % Final   HbA1c, POC (controlled diabetic range)  Date Value Ref Range Status  03/02/2021 6.0 0.0 - 7.0 % Final         Passed - eGFR in normal range and within 360 days    GFR calc Af Amer  Date Value Ref Range Status  12/27/2019 112 >59 mL/min/1.73 Final    Comment:    **[facility] currently reports eGFR in compliance with the current**   recommendations of the [employer]. [facility] will   update reporting as new guidelines are published from the NKF-ASN   Task force.    GFR calc non Af Amer  Date Value Ref Range Status  12/27/2019 97 >59 mL/min/1.73 Final   eGFR  Date Value Ref Range Status  03/02/2021 95 >59 mL/min/1.73 Final         Passed - Valid encounter within last 6 months    Recent Outpatient Visits          2 months ago Type 2 diabetes mellitus with obesity ([place])   [place] Karle Plumber B, MD   6 months ago Type 2 diabetes mellitus with obesity [facility])   [place], MD   10 months ago Type 2 diabetes mellitus with obesity [facility])   [place] Ladell Pier, MD   1 year ago Need for influenza vaccination   [place], RPH-CPP   1 year ago Encounter to establish care   [place], [place], NP      Future Appointments            In 1 month Wynetta Emery Dalbert Batman, MD [place]

## 2021-05-22 ENCOUNTER — Other Ambulatory Visit: Payer: Self-pay | Admitting: Internal Medicine

## 2021-05-22 DIAGNOSIS — E119 Type 2 diabetes mellitus without complications: Secondary | ICD-10-CM

## 2021-05-22 NOTE — Telephone Encounter (Signed)
Requested Prescriptions  Pending Prescriptions Disp Refills   rosuvastatin (CRESTOR) 10 MG tablet [Pharmacy Med Name: ROSUVASTATIN 10MG  TABLETS] 90 tablet 1    Sig: TAKE 1 TABLET(10 MG) BY MOUTH DAILY     Cardiovascular:  Antilipid - Statins Passed - 05/22/2021  6:17 AM      Passed - Total Cholesterol in normal range and within 360 days    Cholesterol, Total  Date Value Ref Range Status  03/02/2021 130 100 - 199 mg/dL Final         Passed - LDL in normal range and within 360 days    LDL Chol Calc (NIH)  Date Value Ref Range Status  03/02/2021 43 0 - 99 mg/dL Final         Passed - HDL in normal range and within 360 days    HDL  Date Value Ref Range Status  03/02/2021 69 >39 mg/dL Final         Passed - Triglycerides in normal range and within 360 days    Triglycerides  Date Value Ref Range Status  03/02/2021 100 0 - 149 mg/dL Final         Passed - Patient is not pregnant      Passed - Valid encounter within last 12 months    Recent Outpatient Visits          2 months ago Type 2 diabetes mellitus with obesity (HCC)   [facility] 03/04/2021 B, MD   6 months ago Type 2 diabetes mellitus with obesity [facility])   [place] [facility] B, MD   10 months ago Type 2 diabetes mellitus with obesity [facility])   [place] [facility], MD   1 year ago Need for influenza vaccination   [facility], Lois [place], RPH-CPP   1 year ago Encounter to establish care   [facility], Zonia Kief, NP      Future Appointments            In 1 month [place] Laural Benes, MD [facility]

## 2021-06-30 ENCOUNTER — Ambulatory Visit: Payer: Medicaid Other | Admitting: Internal Medicine

## 2021-07-01 ENCOUNTER — Emergency Department (HOSPITAL_COMMUNITY): Payer: Medicaid Other

## 2021-07-01 ENCOUNTER — Emergency Department (HOSPITAL_COMMUNITY)
Admission: EM | Admit: 2021-07-01 | Discharge: 2021-07-02 | Disposition: A | Discharge status: Home / Self Care | Payer: Medicaid Other | Attending: Emergency Medicine | Admitting: Emergency Medicine

## 2021-07-01 ENCOUNTER — Other Ambulatory Visit: Payer: Self-pay

## 2021-07-01 DIAGNOSIS — W19XXXA Unspecified fall, initial encounter: Secondary | ICD-10-CM

## 2021-07-01 DIAGNOSIS — W108XXA Fall (on) (from) other stairs and steps, initial encounter: Secondary | ICD-10-CM | POA: Insufficient documentation

## 2021-07-01 DIAGNOSIS — M79662 Pain in left lower leg: Secondary | ICD-10-CM | POA: Insufficient documentation

## 2021-07-01 DIAGNOSIS — E119 Type 2 diabetes mellitus without complications: Secondary | ICD-10-CM | POA: Insufficient documentation

## 2021-07-01 DIAGNOSIS — Z7984 Long term (current) use of oral hypoglycemic drugs: Secondary | ICD-10-CM | POA: Insufficient documentation

## 2021-07-01 DIAGNOSIS — M79605 Pain in left leg: Secondary | ICD-10-CM

## 2021-07-01 DIAGNOSIS — M546 Pain in thoracic spine: Secondary | ICD-10-CM | POA: Insufficient documentation

## 2021-07-01 DIAGNOSIS — Z7982 Long term (current) use of aspirin: Secondary | ICD-10-CM | POA: Insufficient documentation

## 2021-07-01 NOTE — ED Provider Triage Note (Signed)
Emergency Medicine Provider Triage Evaluation Note ? ?Monica Mcfarland , a 51 y.o. female  was evaluated in triage.  Pt complains of left hip/thigh pain, left ankle pain and left foot pain.  Patient states pain began in left ankle, spread down to her left foot and then up into her left thigh.  Patient states that she fell down approximately 5 steps prior to pain beginning, landed on left side of body.  Patient reports she believes that she fell down the stairs due to the shoes she was wearing.  Patient stating she has numbness under her left foot, has a history of diabetes. ? ?Review of Systems  ?Positive: Left hip pain, left ankle pain, left foot pain ?Negative: Nausea, vomiting, loss of consciousness, head strike ? ?Physical Exam  ?BP (!) 141/90 (BP Location: Left Arm)   Pulse 78   Temp 97.6 ?F (36.4 ?C) (Oral)   Resp 19   SpO2 99%  ?Gen:   Awake, no distress   ?Resp:  Normal effort  ?MSK:   Moves extremities without difficulty  ?Other:  No shortening or rotation of left leg.  Patient has 2+ DP pulse to left foot.  Tenderness to palpation of left foot and ankle along with tenderness to palpation of left thigh. ? ?Medical Decision Making  ?Medically screening exam initiated at 7:42 PM.  Appropriate orders placed.  Monica Mcfarland was informed that the remainder of the evaluation will be completed by another provider, this initial triage assessment does not replace that evaluation, and the importance of remaining in the ED until their evaluation is complete. ? ? ?  ?Al Decant, PA-C ?07/01/21 1944 ? ?

## 2021-07-01 NOTE — ED Triage Notes (Signed)
Pt here for L leg pain after falling down approx 5 steps. Pt tripped and slide down the stairs, states pain started in ankle and was shooting up her leg and into foot, now c/o pain to L thigh. ?

## 2021-07-02 MED ORDER — IBUPROFEN 400 MG PO TABS
400.0000 mg | ORAL_TABLET | Freq: Once | ORAL | Status: AC
Start: 2021-07-02 — End: 2021-07-02
  Administered 2021-07-02: 400 mg via ORAL
  Filled 2021-07-02: qty 1

## 2021-07-02 MED ORDER — ACETAMINOPHEN 500 MG PO TABS
1000.0000 mg | ORAL_TABLET | Freq: Once | ORAL | Status: AC
Start: 2021-07-02 — End: 2021-07-02
  Administered 2021-07-02: 1000 mg via ORAL
  Filled 2021-07-02: qty 2

## 2021-07-02 NOTE — ED Notes (Signed)
Pt verbalized understanding of d/c instructions, meds and followup care. Denies questions. VSS, no distress noted. W/C to exit with all belongings.  

## 2021-07-02 NOTE — Discharge Instructions (Addendum)
Nice to meet today, looks like you have a left ankle sprain.  The x-rays did not show any fractures which is great. ?Take Tylenol 1000 mg every 6 hours as needed ?Take ibuprofen 40 mg every 8 hours as needed ?Apply ice to the affected area ?Compression and rest ?Follow-up with PCP as needed.  Come back to the ED if you have worsening pain, difficulty walking, leg numbness or weakness etc. ?

## 2021-07-02 NOTE — ED Provider Notes (Addendum)
?[facility] ?Provider Note ? ? ?CSN: [id number] ?Arrival date & time: 07/01/21  1923 ? ?  ? ?History ? ?Chief Complaint  ?Patient presents with  ? Fall  ? Leg Pain  ? ? ?Monica Mcfarland is a 51 y.o. female. ? ?Monica Mcfarland is a 52 yr old female with PMH of T2DM and HLD presents with left lower leg pain. Patient reports falling down the stairs at 7:30 PM yesterday.  The rubber on her shoes was loose and so she slipped.  She fell down 5 stairs and ended up on her back.  Denies any symptoms prior to the fall.  She reports she twisted her left ankle.  Was able to ambulate 5 minutes later after the fall.  Right now she has left ankle pain which is a 8/10 severity.  No head injury, loss of consciousness, drowsiness, vision changes, vomiting, limb weakness, urinary/fecal incontinence or urinary retention etc. She also reports thoracic back pain radiating down her back with some intermittent paraesthesia.  Of note patient lives in a halfway house.  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  She denies using drugs or alcohol prior to the fall yesterday. ? ?   ? ? ?Home Medications ?Prior to Admission medications   ?Medication Sig Start Date End Date Taking? Authorizing Provider  ?Aspirin 81 MG CAPS Take 4 tablets by mouth daily. For chest pain ?Patient not taking: Reported on 07/01/2020    [provider]  ?Blood Glucose Monitoring Suppl (TRUE METRIX METER) w/Device KIT Use to check blood sugars once per day either fasting/2 hours after a meal or before bedtime ?Patient not taking: Reported on 07/01/2020 07/12/19   Fulp, Ander Gaster, MD  ?glucose blood (TRUE METRIX BLOOD GLUCOSE TEST) test strip Use as instructed to check blood sugar once per day ?Patient not taking: Reported on 07/01/2020 07/12/19   Fulp, Ander Gaster, MD  ?metFORMIN (GLUCOPHAGE-XR) 500 MG 24 hr tablet TAKE 2 TABLETS BY MOUTH TWICE DAILY WITH A MEAL 05/11/21   Ladell Pier, MD   ?rosuvastatin (CRESTOR) 10 MG tablet TAKE 1 TABLET(10 MG) BY MOUTH DAILY 05/22/21   Ladell Pier, MD  ?TRUEplus Lancets 28G MISC Use once daily when checking blood sugars ?Patient not taking: Reported on 07/01/2020 07/12/19   Antony Blackbird, MD  ?   ? ?Allergies    ?Other and Shellfish allergy   ? ?Review of Systems   ?Review of Systems ? ?Physical Exam ?Updated Vital Signs ?BP 111/83 (BP Location: Right Arm)   Pulse 70   Temp 98 ?F (36.7 ?C) (Oral)   Resp 16   SpO2 100%  ?Physical Exam ?Constitutional:   ?   Appearance: Normal appearance.  ?HENT:  ?   Head: Normocephalic and atraumatic.  ?   Nose: Nose normal.  ?Eyes:  ?   Extraocular Movements: Extraocular movements intact.  ?   Pupils: Pupils are equal, round, and reactive to light.  ?Cardiovascular:  ?   Pulses:     ?     Dorsalis pedis pulses are 2+ on the right side.  ?Musculoskeletal:  ?   Cervical back: Normal range of motion.  ?   Thoracic back: Tenderness present.  ?     Feet: ? ?   Comments: Mild midthoracic spinal tenderness  ?Skin: ?   General: Skin is warm and dry.  ?   Capillary Refill: Capillary refill takes less than 2 seconds.  ?  Neurological:  ?   General: No focal deficit present.  ?   Mental Status: She is alert. Mental status is at baseline.  ?   Sensory: Sensation is intact.  ?   Motor: Motor function is intact.  ?   Coordination: Coordination is intact.  ?   Comments: Antalgic gait limping on left limb ?5/5 upper and lower extremity strength ?Normal sensation throughout upper and lower extremities  ? ? ?ED Results / Procedures / Treatments   ?Labs ?(all labs ordered are listed, but only abnormal results are displayed) ?Labs Reviewed - No data to display ? ?EKG ?None ? ?Radiology ?DG Ankle Complete Left ? ?Result Date: 07/01/2021 ?CLINICAL DATA:  Fall. EXAM: LEFT FEMUR 2 VIEWS; LEFT FOOT - COMPLETE 3+ VIEW; LEFT ANKLE COMPLETE - 3+ VIEW COMPARISON:  None. FINDINGS: There is no evidence of fracture or other focal bone lesions. Soft tissues  are unremarkable. IMPRESSION: Negative. Electronically Signed   By: Anner Crete M.D.   On: 07/01/2021 21:00  ? ?DG Foot Complete Left ? ?Result Date: 07/01/2021 ?CLINICAL DATA:  Fall. EXAM: LEFT FEMUR 2 VIEWS; LEFT FOOT - COMPLETE 3+ VIEW; LEFT ANKLE COMPLETE - 3+ VIEW COMPARISON:  None. FINDINGS: There is no evidence of fracture or other focal bone lesions. Soft tissues are unremarkable. IMPRESSION: Negative. Electronically Signed   By: Anner Crete M.D.   On: 07/01/2021 21:00  ? ?DG Femur Min 2 Views Left ? ?Result Date: 07/01/2021 ?CLINICAL DATA:  Fall. EXAM: LEFT FEMUR 2 VIEWS; LEFT FOOT - COMPLETE 3+ VIEW; LEFT ANKLE COMPLETE - 3+ VIEW COMPARISON:  None. FINDINGS: There is no evidence of fracture or other focal bone lesions. Soft tissues are unremarkable. IMPRESSION: Negative. Electronically Signed   By: Anner Crete M.D.   On: 07/01/2021 21:00   ? ?Procedures ?Procedures  ? ? ?Medications Ordered in ED ?Medications  ?ibuprofen (ADVIL) tablet 400 mg (400 mg Oral Given 07/02/21 0816)  ?acetaminophen (TYLENOL) tablet 1,000 mg (1,000 mg Oral Given 07/02/21 0816)  ? ? ?ED Course/ Medical Decision Making/ A&P ?  ?                        ?Medical Decision Making ?Monica Mcfarland is a 51 year old female who presents following a fall down the stairs last night differentials include soft tissue injury versus fracture.  Obtained left foot, ankle, femur x-rays which were negative for fracture therefore most likely diagnosis is soft tissue ligament injury.  On examination patient has mild tenderness on palpation of left lateral ankle with mild edema.  She has good range of movement at left ankle and of her left toes.  Normal sensation and normal cap refill.  Patient was given Tylenol, ibuprofen and Ace wrap in the ED which improved pain.  She was able to ambulate in the ED and was discharged home.  Recommend follow-up with PCP as needed.  Work note provided to patient. ? ?Amount and/or Complexity of Data  Reviewed ?External Data Reviewed: radiology. ?Radiology: ordered and independent interpretation performed. ? ?Risk ?OTC drugs. ?Prescription drug management. ? ?Critical Care ?Total time providing critical care: < 30 minutes ? ? ? ? ? ? ? ? ?Final Clinical Impression(s) / ED Diagnoses ?Final diagnoses:  ?Fall, initial encounter  ?Left leg pain  ?Acute midline thoracic back pain  ? ? ?Rx / DC Orders ?ED Discharge Orders   ? ? None  ? ?  ? ? ?  ?Lattie Haw, MD ?07/02/21 [phone] ? ?  ?  Lattie Haw, MD ?07/02/21 0825 ? ?  ?Elnora Morrison, MD ?07/06/21 [phone] ? ?

## 2021-07-08 ENCOUNTER — Other Ambulatory Visit: Payer: Self-pay | Admitting: Internal Medicine

## 2021-07-08 DIAGNOSIS — E119 Type 2 diabetes mellitus without complications: Secondary | ICD-10-CM

## 2021-07-08 NOTE — Telephone Encounter (Signed)
Requested Prescriptions  ?Pending Prescriptions Disp Refills  ?? metFORMIN (GLUCOPHAGE-XR) 500 MG 24 hr tablet [Pharmacy Med Name: METFORMIN ER 500MG 24HR TABS] 120 tablet 1  ?  Sig: TAKE 2 TABLETS BY MOUTH TWICE DAILY WITH A MEAL  ?  ? Endocrinology:  Diabetes - Biguanides Failed - 07/08/2021  3:36 AM  ?  ?  Failed - B12 Level in normal range and within 720 days  ?  No results found for: VITAMINB12   ?  ?  Failed - CBC within normal limits and completed in the last 12 months  ?  WBC  ?Date Value Ref Range Status  ?03/02/2021 6.6 3.4 - 10.8 x10E3/uL Final  ?11/23/2019 6.6 4.0 - 10.5 K/uL Final  ? ?RBC  ?Date Value Ref Range Status  ?03/02/2021 4.31 3.77 - 5.28 x10E6/uL Final  ?11/23/2019 4.30 3.87 - 5.11 MIL/uL Final  ? ?Hemoglobin  ?Date Value Ref Range Status  ?03/02/2021 13.2 11.1 - 15.9 g/dL Final  ? ?Hematocrit  ?Date Value Ref Range Status  ?03/02/2021 38.6 34.0 - 46.6 % Final  ? ?MCHC  ?Date Value Ref Range Status  ?03/02/2021 34.2 31.5 - 35.7 g/dL Final  ?11/23/2019 32.7 30.0 - 36.0 g/dL Final  ? ?MCH  ?Date Value Ref Range Status  ?03/02/2021 30.6 26.6 - 33.0 pg Final  ?11/23/2019 30.5 26.0 - 34.0 pg Final  ? ?MCV  ?Date Value Ref Range Status  ?03/02/2021 90 79 - 97 fL Final  ? ?No results found for: PLTCOUNTKUC, LABPLAT, [place] ?RDW  ?Date Value Ref Range Status  ?03/02/2021 11.9 11.7 - 15.4 % Final  ? ?  ?  ?  Passed - Cr in normal range and within 360 days  ?  Creatinine, Ser  ?Date Value Ref Range Status  ?03/02/2021 0.76 0.57 - 1.00 mg/dL Final  ?   ?  ?  Passed - HBA1C is between 0 and 7.9 and within 180 days  ?  HbA1c, POC (prediabetic range)  ?Date Value Ref Range Status  ?04/01/2020 6.1 5.7 - 6.4 % Final  ? ?HbA1c, POC (controlled diabetic range)  ?Date Value Ref Range Status  ?03/02/2021 6.0 0.0 - 7.0 % Final  ?   ?  ?  Passed - eGFR in normal range and within 360 days  ?  GFR calc Af Amer  ?Date Value Ref Range Status  ?12/27/2019 112 >59 mL/min/1.73 Final  ?  Comment:  ?  **[facility] currently  reports eGFR in compliance with the current** ?  recommendations of the [employer]. [facility] will ?  update reporting as new guidelines are published from the NKF-ASN ?  Task force. ?  ? ?GFR calc non Af Amer  ?Date Value Ref Range Status  ?12/27/2019 97 >59 mL/min/1.73 Final  ? ?eGFR  ?Date Value Ref Range Status  ?03/02/2021 95 >59 mL/min/1.73 Final  ?   ?  ?  Passed - Valid encounter within last 6 months  ?  Recent Outpatient Visits   ?      ? 4 months ago Type 2 diabetes mellitus with obesity ([place])  ? [place] Karle Plumber B, MD  ? 8 months ago Type 2 diabetes mellitus with obesity ([place])  ? [place] Karle Plumber B, MD  ? 1 year ago Type 2 diabetes mellitus with obesity ([place])  ? [place] Ladell Pier, MD  ? 1 year ago Need for influenza vaccination  ?  [place], RPH-CPP  ? 1 year ago Encounter to establish care  ? [place], [place], NP  ?  ?  ? ?  ?  ?  ? ?

## 2021-09-01 ENCOUNTER — Ambulatory Visit: Payer: Self-pay | Attending: Internal Medicine | Admitting: Internal Medicine

## 2021-09-01 ENCOUNTER — Encounter: Payer: Self-pay | Admitting: Internal Medicine

## 2021-09-01 VITALS — BP 108/72 | HR 65 | Temp 98.1°F | Resp 16 | Ht 66.0 in | Wt 212.0 lb

## 2021-09-01 DIAGNOSIS — Z7984 Long term (current) use of oral hypoglycemic drugs: Secondary | ICD-10-CM | POA: Insufficient documentation

## 2021-09-01 DIAGNOSIS — E785 Hyperlipidemia, unspecified: Secondary | ICD-10-CM

## 2021-09-01 DIAGNOSIS — R5383 Other fatigue: Secondary | ICD-10-CM | POA: Insufficient documentation

## 2021-09-01 DIAGNOSIS — N644 Mastodynia: Secondary | ICD-10-CM | POA: Insufficient documentation

## 2021-09-01 DIAGNOSIS — E119 Type 2 diabetes mellitus without complications: Secondary | ICD-10-CM | POA: Insufficient documentation

## 2021-09-01 DIAGNOSIS — Z1211 Encounter for screening for malignant neoplasm of colon: Secondary | ICD-10-CM | POA: Insufficient documentation

## 2021-09-01 DIAGNOSIS — Z6834 Body mass index (BMI) 34.0-34.9, adult: Secondary | ICD-10-CM | POA: Insufficient documentation

## 2021-09-01 DIAGNOSIS — E782 Mixed hyperlipidemia: Secondary | ICD-10-CM | POA: Insufficient documentation

## 2021-09-01 DIAGNOSIS — Z79899 Other long term (current) drug therapy: Secondary | ICD-10-CM | POA: Insufficient documentation

## 2021-09-01 DIAGNOSIS — E669 Obesity, unspecified: Secondary | ICD-10-CM | POA: Insufficient documentation

## 2021-09-01 DIAGNOSIS — E1169 Type 2 diabetes mellitus with other specified complication: Secondary | ICD-10-CM

## 2021-09-01 LAB — GLUCOSE, POCT (MANUAL RESULT ENTRY): POC Glucose: 157 mg/dl — AB (ref 70–99)

## 2021-09-01 LAB — POCT GLYCOSYLATED HEMOGLOBIN (HGB A1C): HbA1c, POC (prediabetic range): 6 % (ref 5.7–6.4)

## 2021-09-01 NOTE — Progress Notes (Signed)
Diabetes

## 2021-09-01 NOTE — Progress Notes (Signed)
? ? ?Patient ID: Monica Mcfarland, female    DOB: 1970-07-08  MRN: [id number] ? ?CC: Diabetes ? ? ?Subjective: ?Monica Mcfarland is a 51 y.o. female who presents for chronic ds management ?Her concerns today include:  ?DM type II, HL, obesity ? ?DM/Obesity: ?Results for orders placed or performed in visit on 09/01/21  ?Glucose (CBG)  ?Result Value Ref Range  ? POC Glucose 157 (A) 70 - 99 mg/dl  ?HgB A1c  ?Result Value Ref Range  ? Hemoglobin A1C    ? HbA1c POC (<> result, manual entry)    ? HbA1c, POC (prediabetic range) 6.0 5.7 - 6.4 %  ? HbA1c, POC (controlled diabetic range)    ?Taking the Metformin XR 500 mg BID ?Trying to lose wgh ?Good with portion sizes, eating adequate adequate fruits.  Struggle with bread and still drinks one can of regular Pepsi a day ?Walks Tue/Thur at a leasure pace for 25 min.  Also does some exercises in her room. ? ?HL:  taking and tolerating Crestor ? ?C/o feeling exhausted despite getting in 7-8 hrs of sleep at nights ?Usually in bed b/w 10-10:30 p.m. Falls asleep around 11 p.m, wakes at 7 a.m.  However, through out the night she feels like she tosses and turns. ?Wakes up 4-5 times at night to look at clock.  Can take 30 mins to fall back asleep ?Not sure if she snores except when tired.   ?No morning HA. Feels sleepy during the day.  No falling asleep when driving.   ?CBC nl 01/2021.  ?Denies any issue with depression.  ? ?C/o crampy pain in RT breast intermttently x 1 wk.  ?Never had MMG ? ?HM:  due for MMG, colon CA screen, eye exam, PAP ?Patient Active Problem List  ? Diagnosis Date Noted  ? Hyperlipidemia associated with type 2 diabetes mellitus ([place]) 07/01/2020  ? Influenza vaccine needed 04/01/2020  ? Type 2 diabetes mellitus with obesity ([place]) 07/15/2019  ?  ? ?Current Outpatient Medications on File Prior to Visit  ?Medication Sig Dispense Refill  ? Aspirin 81 MG CAPS Take 4 tablets by mouth daily. For chest pain    ? Blood Glucose Monitoring Suppl (TRUE METRIX METER)  w/Device KIT Use to check blood sugars once per day either fasting/2 hours after a meal or before bedtime 1 kit 0  ? glucose blood (TRUE METRIX BLOOD GLUCOSE TEST) test strip Use as instructed to check blood sugar once per day 100 each 3  ? metFORMIN (GLUCOPHAGE-XR) 500 MG 24 hr tablet TAKE 2 TABLETS BY MOUTH TWICE DAILY WITH A MEAL 120 tablet 1  ? rosuvastatin (CRESTOR) 10 MG tablet TAKE 1 TABLET(10 MG) BY MOUTH DAILY 90 tablet 1  ? TRUEplus Lancets 28G MISC Use once daily when checking blood sugars 100 each 3  ? ?No current facility-administered medications on file prior to visit.  ? ? ?Allergies  ?Allergen Reactions  ? Other Anaphylaxis  ?  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 Shellfish Allergy Anaphylaxis  ? ? ?Social History  ? ?Socioeconomic History  ? Marital status: Single  ?  Spouse name: Not on file  ? Number of children: Not on file  ? Years of education: Not on file  ? Highest education level: Not on file  ?Occupational History  ? Not on file  ?Tobacco Use  ? Smoking status: Former  ?  Types: Cigarettes  ?  Start date: 07/12/2015  ? Smokeless tobacco: Never  ?Substance and  Sexual Activity  ? Alcohol use: Not Currently  ? Drug use: Not Currently  ? Sexual activity: Not Currently  ?Other Topics Concern  ? Not on file  ?Social History Narrative  ? Not on file  ? ?Social Determinants of Health  ? ?Financial Resource Strain: Not on file  ?Food Insecurity: Not on file  ?Transportation Needs: Not on file  ?Physical Activity: Not on file  ?Stress: Not on file  ?Social Connections: Not on file  ?Intimate Partner Violence: Not on file  ? ? ?Family History  ?Problem Relation Age of Onset  ? Diabetes Mother   ? Heart disease Mother   ? Diabetes Father   ? Hearing loss Maternal Grandmother   ? Hearing loss Maternal Grandfather   ? Diabetes Sister   ? ? ?Past Surgical History:  ?Procedure Laterality Date  ? CESAREAN SECTION    ? 3 children   ? ? ?ROS: ?Review of Systems ?Negative except as stated  above ? ?PHYSICAL EXAM: ?BP 108/72 (BP Location: Left Arm, Patient Position: Sitting, Cuff Size: Large)   Pulse 65   Temp 98.1 ?F (36.7 ?C) (Oral)   Resp 16   Ht 5' 6" (1.676 m)   Wt 212 lb (96.2 kg)   SpO2 99%   BMI 34.22 kg/m?   ?Wt Readings from Last 3 Encounters:  ?09/01/21 212 lb (96.2 kg)  ?03/02/21 208 lb (94.3 kg)  ?10/30/20 198 lb (89.8 kg)  ? ? ?Physical Exam ? ?General appearance - alert, well appearing, and in no distress ?Mental status - normal mood, behavior, speech, dress, motor activity, and thought processes ?Neck - supple, no significant adenopathy ?Chest - clear to auscultation, no wheezes, rales or rhonchi, symmetric air entry ?Heart - normal rate, regular rhythm, normal S1, S2, no murmurs, rubs, clicks or gallops ?Breasts - RN Carilyn Goodpasture present:   breasts appear normal, no suspicious masses, no skin or nipple changes or axillary nodes ?Extremities - peripheral pulses normal, no pedal edema, no clubbing or cyanosis ? ? ? ?  Latest Ref Rng & Units 03/02/2021  ? 12:03 PM 07/01/2020  ? 11:37 AM 12/27/2019  ? 11:23 AM  ?CMP  ?Glucose 70 - 99 mg/dL 111    92    ?BUN 6 - 24 mg/dL 12    21    ?Creatinine 0.57 - 1.00 mg/dL 0.76    0.73    ?Sodium 134 - 144 mmol/L 142    142    ?Potassium 3.5 - 5.2 mmol/L 4.8    4.8    ?Chloride 96 - 106 mmol/L 102    103    ?CO2 20 - 29 mmol/L 26    27    ?Calcium 8.7 - 10.2 mg/dL 9.5    9.5    ?Total Protein 6.0 - 8.5 g/dL  7.5     ?Total Bilirubin 0.0 - 1.2 mg/dL  0.9     ?Alkaline Phos 44 - 121 IU/L  81     ?AST 0 - 40 IU/L  18     ?ALT 0 - 32 IU/L  10     ? ?Lipid Panel  ?   ?Component Value Date/Time  ? CHOL 130 03/02/2021 1203  ? TRIG 100 03/02/2021 1203  ? HDL 69 03/02/2021 1203  ? CHOLHDL 1.9 03/02/2021 1203  ? [place] 43 03/02/2021 1203  ? ? ?CBC ?   ?Component Value Date/Time  ? WBC 6.6 03/02/2021 1203  ? WBC 6.6 11/23/2019 1415  ? RBC  4.31 03/02/2021 1203  ? RBC 4.30 11/23/2019 1415  ? HGB 13.2 03/02/2021 1203  ? HCT 38.6 03/02/2021 1203  ? PLT 199  03/02/2021 1203  ? MCV 90 03/02/2021 1203  ? MCH 30.6 03/02/2021 1203  ? MCH 30.5 11/23/2019 1415  ? MCHC 34.2 03/02/2021 1203  ? MCHC 32.7 11/23/2019 1415  ? RDW 11.9 03/02/2021 1203  ? ? ? ?ASSESSMENT AND PLAN: ?1. Type 2 diabetes mellitus with obesity ([place]) ?A1c at goal.  Continue current dose of metformin. ?Continue to encourage healthy eating habits.  Advised changing to whole-grain wheat bread.  Advised to try to eliminate sugary drinks completely. ?Challenged her to increase walking to 3 days a week.  She is willing to take that challenge. ?- Glucose (CBG) ?- HgB A1c ?- Microalbumin / creatinine urine ratio ? ?2. Hyperlipidemia associated with type 2 diabetes mellitus ([place]) ?Continue Crestor.  Last LDL was 43 which is at goal. ? ?3. Other fatigue ?I would like to rule out sleep apnea however patient is uninsured.  I have told her how to apply for the [employer] discount.  Once approved we can refer her for sleep study. ?Good sleep hygiene discussed and encouraged. ?Patient advised not to drink any caffeinated beverages or excessive alcohol use within several hours of bedtime.  Advised to get in bed around about the same time every night.  Once in bed, turn off all lights and sounds.  If unable to fall asleep within 30 to 45 minutes of getting in bed, patient should get up and try to do something until she feels sleepy again.  At that time try getting back in bed. ?-recommend trying Melatonin 3 mg OTC to see if it helps with sleep maintenance ? ?4. Mastalgia in female ?Exam did not reveal any abnormal findings.  ?Patient given mammogram scholarship form which she completed today. ?- MM Digital Diagnostic Unilat R; Future ? ?5. Screening for colon cancer ?Patient is uninsured.  She is at average risk for colon cancer.  She is agreeable to doing the fit test. ?- Fecal occult blood, imunochemical ? ?Pt will keep appt in 12/04/2021 for PAP ? ?Patient was given the opportunity to ask questions.  Patient verbalized  understanding of the plan and was able to repeat key elements of the plan.  ? ?This documentation was completed using Radio producer.  Any transcriptional errors are unintentional. ? ?Orders Placed This Enc

## 2021-09-02 LAB — MICROALBUMIN / CREATININE URINE RATIO
Creatinine, Urine: 148.9 mg/dL
Microalb/Creat Ratio: 10 mg/g creat (ref 0–29)
Microalbumin, Urine: 14.4 ug/mL

## 2021-09-09 ENCOUNTER — Other Ambulatory Visit: Payer: Self-pay | Admitting: Internal Medicine

## 2021-09-09 DIAGNOSIS — N644 Mastodynia: Secondary | ICD-10-CM

## 2021-09-12 ENCOUNTER — Other Ambulatory Visit: Payer: Self-pay | Admitting: Internal Medicine

## 2021-09-12 DIAGNOSIS — E119 Type 2 diabetes mellitus without complications: Secondary | ICD-10-CM

## 2021-09-14 NOTE — Telephone Encounter (Signed)
Requested Prescriptions  ?Pending Prescriptions Disp Refills  ?? metFORMIN (GLUCOPHAGE-XR) 500 MG 24 hr tablet [Pharmacy Med Name: METFORMIN ER 500MG 24HR TABS] 120 tablet 1  ?  Sig: TAKE 2 TABLETS BY MOUTH TWICE DAILY WITH A MEAL  ?  ? Endocrinology:  Diabetes - Biguanides Failed - 09/12/2021  3:38 AM  ?  ?  Failed - B12 Level in normal range and within 720 days  ?  No results found for: VITAMINB12   ?  ?  Failed - CBC within normal limits and completed in the last 12 months  ?  WBC  ?Date Value Ref Range Status  ?03/02/2021 6.6 3.4 - 10.8 x10E3/uL Final  ?11/23/2019 6.6 4.0 - 10.5 K/uL Final  ? ?RBC  ?Date Value Ref Range Status  ?03/02/2021 4.31 3.77 - 5.28 x10E6/uL Final  ?11/23/2019 4.30 3.87 - 5.11 MIL/uL Final  ? ?Hemoglobin  ?Date Value Ref Range Status  ?03/02/2021 13.2 11.1 - 15.9 g/dL Final  ? ?Hematocrit  ?Date Value Ref Range Status  ?03/02/2021 38.6 34.0 - 46.6 % Final  ? ?MCHC  ?Date Value Ref Range Status  ?03/02/2021 34.2 31.5 - 35.7 g/dL Final  ?11/23/2019 32.7 30.0 - 36.0 g/dL Final  ? ?MCH  ?Date Value Ref Range Status  ?03/02/2021 30.6 26.6 - 33.0 pg Final  ?11/23/2019 30.5 26.0 - 34.0 pg Final  ? ?MCV  ?Date Value Ref Range Status  ?03/02/2021 90 79 - 97 fL Final  ? ?No results found for: PLTCOUNTKUC, LABPLAT, [place] ?RDW  ?Date Value Ref Range Status  ?03/02/2021 11.9 11.7 - 15.4 % Final  ? ?  ?  ?  Passed - Cr in normal range and within 360 days  ?  Creatinine, Ser  ?Date Value Ref Range Status  ?03/02/2021 0.76 0.57 - 1.00 mg/dL Final  ?   ?  ?  Passed - HBA1C is between 0 and 7.9 and within 180 days  ?  HbA1c, POC (prediabetic range)  ?Date Value Ref Range Status  ?09/01/2021 6.0 5.7 - 6.4 % Final  ?   ?  ?  Passed - eGFR in normal range and within 360 days  ?  GFR calc Af Amer  ?Date Value Ref Range Status  ?12/27/2019 112 >59 mL/min/1.73 Final  ?  Comment:  ?  **[facility] currently reports eGFR in compliance with the current** ?  recommendations of the [employer]. [facility]  will ?  update reporting as new guidelines are published from the NKF-ASN ?  Task force. ?  ? ?GFR calc non Af Amer  ?Date Value Ref Range Status  ?12/27/2019 97 >59 mL/min/1.73 Final  ? ?eGFR  ?Date Value Ref Range Status  ?03/02/2021 95 >59 mL/min/1.73 Final  ?   ?  ?  Passed - Valid encounter within last 6 months  ?  Recent Outpatient Visits   ?      ? 1 week ago Type 2 diabetes mellitus with obesity ([place])  ? [place] Karle Plumber B, MD  ? 6 months ago Type 2 diabetes mellitus with obesity ([place])  ? [place] Ladell Pier, MD  ? 10 months ago Type 2 diabetes mellitus with obesity ([place])  ? [place] Karle Plumber B, MD  ? 1 year ago Type 2 diabetes mellitus with obesity ([place])  ? [place] Ladell Pier, MD  ? 1 year ago Need for  influenza vaccination  ? [place], RPH-CPP  ?  ?  ?Future Appointments   ?        ? In 2 months Ladell Pier, MD [place]  ?  ? ?  ?  ?  ? ? ?

## 2021-11-19 ENCOUNTER — Other Ambulatory Visit: Payer: Self-pay | Admitting: Internal Medicine

## 2021-11-19 DIAGNOSIS — E119 Type 2 diabetes mellitus without complications: Secondary | ICD-10-CM

## 2021-12-04 ENCOUNTER — Ambulatory Visit: Payer: Medicaid Other | Attending: Internal Medicine | Admitting: Internal Medicine

## 2021-12-04 ENCOUNTER — Encounter: Payer: Self-pay | Admitting: Internal Medicine

## 2021-12-04 VITALS — BP 111/75 | HR 77 | Ht 66.0 in | Wt 200.2 lb

## 2021-12-04 DIAGNOSIS — R42 Dizziness and giddiness: Secondary | ICD-10-CM | POA: Insufficient documentation

## 2021-12-04 DIAGNOSIS — E669 Obesity, unspecified: Secondary | ICD-10-CM | POA: Insufficient documentation

## 2021-12-04 DIAGNOSIS — E119 Type 2 diabetes mellitus without complications: Secondary | ICD-10-CM | POA: Insufficient documentation

## 2021-12-04 DIAGNOSIS — Z23 Encounter for immunization: Secondary | ICD-10-CM | POA: Insufficient documentation

## 2021-12-04 DIAGNOSIS — E785 Hyperlipidemia, unspecified: Secondary | ICD-10-CM | POA: Insufficient documentation

## 2021-12-04 DIAGNOSIS — R0609 Other forms of dyspnea: Secondary | ICD-10-CM

## 2021-12-04 DIAGNOSIS — K521 Toxic gastroenteritis and colitis: Secondary | ICD-10-CM

## 2021-12-04 DIAGNOSIS — Z6832 Body mass index (BMI) 32.0-32.9, adult: Secondary | ICD-10-CM | POA: Insufficient documentation

## 2021-12-04 DIAGNOSIS — E1169 Type 2 diabetes mellitus with other specified complication: Secondary | ICD-10-CM

## 2021-12-04 DIAGNOSIS — R0600 Dyspnea, unspecified: Secondary | ICD-10-CM | POA: Insufficient documentation

## 2021-12-04 DIAGNOSIS — R197 Diarrhea, unspecified: Secondary | ICD-10-CM | POA: Insufficient documentation

## 2021-12-04 LAB — POCT GLYCOSYLATED HEMOGLOBIN (HGB A1C): HbA1c, POC (controlled diabetic range): 5.9 % (ref 0.0–7.0)

## 2021-12-04 LAB — GLUCOSE, POCT (MANUAL RESULT ENTRY): POC Glucose: 102 mg/dl — AB (ref 70–99)

## 2021-12-04 NOTE — Progress Notes (Signed)
Patient ID: Monica Mcfarland, female    DOB: 1970-12-26  MRN: [id number]  CC: Diabetes   Subjective: Monica Mcfarland is a 51 y.o. female who presents for chronic ds management Her concerns today include:  DM type II, HL, obesity  DM/obesity: Results for orders placed or performed in visit on 12/04/21  POCT glucose (manual entry)  Result Value Ref Range   POC Glucose 102 (A) 70 - 99 mg/dl  POCT glycosylated hemoglobin (Hb A1C)  Result Value Ref Range   Hemoglobin A1C     HbA1c POC (<> result, manual entry)     HbA1c, POC (prediabetic range)     HbA1c, POC (controlled diabetic range) 5.9 0.0 - 7.0 %  Reports compliance with taking metformin XR 500 mg twice daily. Checking BS BID.  Before BF range 97-108 and bedtime 115-130 Eating smaller portions.  Snacking on fruits.  Exercises every day.  Down 12 lbs since last visit in May 2023.  She is very proud of this. HL: Taking and tolerating Crestor.  Was having diarrhea 2-3 times a day for 3 mth.  Now taking Probiotic from OTC and diarrhea has stopped. Started taking it 10 days ago.    Complains of intermittent dizziness times a few weeks.  Occurs if she gets up too quickly but can also occur if she is just walking around.  Last 1 to 2 minutes.  No hearing changes.  Drinking adequate water.  Complains of shortness of breath with minimal exertion like chest walking through her house or making her bed up.  Going on for the past month.  However she does not notice any excessive shortness of breath when she does her exercising.  No chest pains, lower extremity swelling or recent long distance travel.  She is not on any birth control pills or shots.  No chronic cough or wheezing.  No blood in the stools.  She is postmenopausal.  HM: given FIT kit on last visit.  Turned it in this a.m.  Did heard back from [facility].  States they were going to send her something in mail but she never received anything.  Due for shingles vaccine.  Over  due for DM eye exam Patient Active Problem List   Diagnosis Date Noted   Hyperlipidemia associated with type 2 diabetes mellitus ([place]) 07/01/2020   Influenza vaccine needed 04/01/2020   Type 2 diabetes mellitus with obesity ([place]) 07/15/2019     Current Outpatient Medications on File Prior to Visit  Medication Sig Dispense Refill   Blood Glucose Monitoring Suppl (TRUE METRIX METER) w/Device KIT Use to check blood sugars once per day either fasting/2 hours after a meal or before bedtime 1 kit 0   glucose blood (TRUE METRIX BLOOD GLUCOSE TEST) test strip Use as instructed to check blood sugar once per day 100 each 3   metFORMIN (GLUCOPHAGE-XR) 500 MG 24 hr tablet TAKE 2 TABLETS BY MOUTH TWICE DAILY WITH A MEAL 120 tablet 2   rosuvastatin (CRESTOR) 10 MG tablet TAKE 1 TABLET(10 MG) BY MOUTH DAILY 90 tablet 0   TRUEplus Lancets 28G MISC Use once daily when checking blood sugars 100 each 3   Aspirin 81 MG CAPS Take 4 tablets by mouth daily. For chest pain (Patient not taking: Reported on 12/04/2021)     No current facility-administered medications on file prior to visit.    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, rash, hives  Shellfish Allergy Anaphylaxis    Social History   Socioeconomic History   Marital status: Single    Spouse name: Not on file   Number of children: Not on file   Years of education: Not on file   Highest education level: Not on file  Occupational History   Not on file  Tobacco Use   Smoking status: Former    Types: Cigarettes    Start date: 07/12/2015   Smokeless tobacco: Never  Substance and Sexual Activity   Alcohol use: Not Currently   Drug use: Not Currently   Sexual activity: Not Currently  Other Topics Concern   Not on file  Social History Narrative   Not on file   Social Determinants of Health   Financial Resource Strain: Not on file  Food Insecurity: Not on file  Transportation Needs: Not on  file  Physical Activity: Not on file  Stress: Not on file  Social Connections: Not on file  Intimate Partner Violence: Not on file    Family History  Problem Relation Age of Onset   Diabetes Mother    Heart disease Mother    Diabetes Father    Hearing loss Maternal Grandmother    Hearing loss Maternal Grandfather    Diabetes Sister     Past Surgical History:  Procedure Laterality Date   CESAREAN SECTION     3 children     ROS: Review of Systems Negative except as stated above  PHYSICAL EXAM: BP 111/75   Pulse 77   Ht 5' 6"  (1.676 m)   Wt 200 lb 3.2 oz (90.8 kg)   SpO2 97%   BMI 32.31 kg/m   Wt Readings from Last 3 Encounters:  12/04/21 200 lb 3.2 oz (90.8 kg)  09/01/21 212 lb (96.2 kg)  03/02/21 208 lb (94.3 kg)  Sitting: BP 121/83, P69 Standing: BP 113/77, P66 Pulse ox at rest 99 to 100%. Pulse ox with ambulation 96 to 99%.  Patient did not appear dyspneic when I walked her up and down the hall for pulse ox check.  Physical Exam  General appearance - alert, well appearing, and in no distress Mental status - normal mood, behavior, speech, dress, motor activity, and thought processes Eyes -slightly pale conjunctiva Neck - supple, no significant adenopathy Chest - clear to auscultation, no wheezes, rales or rhonchi, symmetric air entry Heart - normal rate, regular rhythm, normal S1, S2, no murmurs, rubs, clicks or gallops Neurological - alert, oriented, normal speech, no focal findings or movement disorder noted, cranial nerves II through XII intact, motor and sensory grossly normal bilaterally.  Gait is normal.  Romberg negative Extremities - peripheral pulses normal, no pedal edema, no clubbing or cyanosis      Latest Ref Rng & Units 03/02/2021   12:03 PM 07/01/2020   11:37 AM 12/27/2019   11:23 AM  CMP  Glucose 70 - 99 mg/dL 111   92   BUN 6 - 24 mg/dL 12   21   Creatinine 0.57 - 1.00 mg/dL 0.76   0.73   Sodium 134 - 144 mmol/L 142   142   Potassium 3.5  - 5.2 mmol/L 4.8   4.8   Chloride 96 - 106 mmol/L 102   103   CO2 20 - 29 mmol/L 26   27   Calcium 8.7 - 10.2 mg/dL 9.5   9.5   Total Protein 6.0 - 8.5 g/dL  7.5    Total Bilirubin 0.0 - 1.2 mg/dL  0.9    Alkaline Phos 44 - 121 IU/L  81    AST 0 - 40 IU/L  18    ALT 0 - 32 IU/L  10     Lipid Panel     Component Value Date/Time   CHOL 130 03/02/2021 1203   TRIG 100 03/02/2021 1203   HDL 69 03/02/2021 1203   CHOLHDL 1.9 03/02/2021 1203   LDLCALC 43 03/02/2021 1203    CBC    Component Value Date/Time   WBC 6.6 03/02/2021 1203   WBC 6.6 11/23/2019 1415   RBC 4.31 03/02/2021 1203   RBC 4.30 11/23/2019 1415   HGB 13.2 03/02/2021 1203   HCT 38.6 03/02/2021 1203   PLT 199 03/02/2021 1203   MCV 90 03/02/2021 1203   MCH 30.6 03/02/2021 1203   MCH 30.5 11/23/2019 1415   MCHC 34.2 03/02/2021 1203   MCHC 32.7 11/23/2019 1415   RDW 11.9 03/02/2021 1203    ASSESSMENT AND PLAN: 1. Type 2 diabetes mellitus with obesity (HCC) Good control. Commended her on weight loss.  Encouraged her to continue healthy eating habits and regular exercise. Given that she had been having some diarrhea, I recommend decreasing the metformin to 1 tablet daily of the 500 mg XR. - POCT glucose (manual entry) - POCT glycosylated hemoglobin (Hb A1C) - Comprehensive metabolic panel  2. Hyperlipidemia associated with type 2 diabetes mellitus (HCC) Continue Crestor.  LDL at goal  3. Diarrhea due to drug Likely due to metformin.  See #1 above  4. Dizziness Advised to go slow with position changes.  We will check some baseline labs today. - CBC - Comprehensive metabolic panel  5. DOE (dyspnea on exertion) Exam does not suggest CHF.  I heard no murmurs.  We will check CBC to rule out anemia. I had also meant to add a D-dimer.  We may have to have her return to the lab to have this done. - Brain natriuretic peptide  6. Need for shingles vaccine Patient agreed to receive Shingrix vaccine today but we  were out of it.  We will plan to give it on next visit.    Patient was given the opportunity to ask questions.  Patient verbalized understanding of the plan and was able to repeat key elements of the plan.   This documentation was completed using Radio producer.  Any transcriptional errors are unintentional.  Orders Placed This Encounter  Procedures   Brain natriuretic peptide   CBC   Comprehensive metabolic panel   POCT glucose (manual entry)   POCT glycosylated hemoglobin (Hb A1C)     Requested Prescriptions    No prescriptions requested or ordered in this encounter    Return in about 6 weeks (around 01/15/2022) for PAP.  Karle Plumber, MD, FACP

## 2021-12-04 NOTE — Patient Instructions (Signed)
Decrease Metformin to one tablet daily. Go slow with position changes.

## 2021-12-05 LAB — COMPREHENSIVE METABOLIC PANEL
ALT: 11 IU/L (ref 0–32)
AST: 20 IU/L (ref 0–40)
Albumin/Globulin Ratio: 1.7 (ref 1.2–2.2)
Albumin: 4.8 g/dL (ref 3.8–4.9)
Alkaline Phosphatase: 80 IU/L (ref 44–121)
BUN/Creatinine Ratio: 20 (ref 9–23)
BUN: 13 mg/dL (ref 6–24)
Bilirubin Total: 0.9 mg/dL (ref 0.0–1.2)
CO2: 25 mmol/L (ref 20–29)
Calcium: 9.4 mg/dL (ref 8.7–10.2)
Chloride: 102 mmol/L (ref 96–106)
Creatinine, Ser: 0.65 mg/dL (ref 0.57–1.00)
Globulin, Total: 2.9 g/dL (ref 1.5–4.5)
Glucose: 95 mg/dL (ref 70–99)
Potassium: 3.9 mmol/L (ref 3.5–5.2)
Sodium: 140 mmol/L (ref 134–144)
Total Protein: 7.7 g/dL (ref 6.0–8.5)
eGFR: 107 mL/min/{1.73_m2} (ref >59)

## 2021-12-05 LAB — CBC
Hematocrit: 37.2 % (ref 34.0–46.6)
Hemoglobin: 12.5 g/dL (ref 11.1–15.9)
MCH: 30.1 pg (ref 26.6–33.0)
MCHC: 33.6 g/dL (ref 31.5–35.7)
MCV: 90 fL (ref 79–97)
Platelets: 189 10*3/uL (ref 150–450)
RBC: 4.15 x10E6/uL (ref 3.77–5.28)
RDW: 12.3 % (ref 11.7–15.4)
WBC: 6.7 10*3/uL (ref 3.4–10.8)

## 2021-12-05 LAB — BRAIN NATRIURETIC PEPTIDE: BNP: 25.4 pg/mL (ref 0.0–100.0)

## 2021-12-06 LAB — FECAL OCCULT BLOOD, IMMUNOCHEMICAL: Fecal Occult Bld: NEGATIVE

## 2021-12-08 ENCOUNTER — Telehealth: Payer: Self-pay

## 2021-12-08 NOTE — Telephone Encounter (Signed)
Telephoned patient at mobile number. Patient completed scholarship application. Patient will call back and schedule [facility] appoinment.

## 2021-12-09 ENCOUNTER — Other Ambulatory Visit: Payer: Self-pay

## 2021-12-09 DIAGNOSIS — N644 Mastodynia: Secondary | ICD-10-CM

## 2021-12-16 ENCOUNTER — Other Ambulatory Visit: Payer: Self-pay | Admitting: Internal Medicine

## 2021-12-16 DIAGNOSIS — E119 Type 2 diabetes mellitus without complications: Secondary | ICD-10-CM

## 2022-02-02 ENCOUNTER — Ambulatory Visit
Admission: RE | Admit: 2022-02-02 | Discharge: 2022-02-02 | Disposition: A | Discharge status: Home / Self Care | Payer: Medicaid Other | Source: Ambulatory Visit | Attending: Obstetrics and Gynecology | Admitting: Obstetrics and Gynecology

## 2022-02-02 ENCOUNTER — Ambulatory Visit
Admission: RE | Admit: 2022-02-02 | Discharge: 2022-02-02 | Disposition: A | Discharge status: Home / Self Care | Payer: No Typology Code available for payment source | Source: Ambulatory Visit | Attending: Obstetrics and Gynecology | Admitting: Obstetrics and Gynecology

## 2022-02-02 ENCOUNTER — Ambulatory Visit: Payer: Self-pay | Admitting: *Deleted

## 2022-02-02 VITALS — BP 118/82 | Wt 205.2 lb

## 2022-02-02 DIAGNOSIS — N644 Mastodynia: Secondary | ICD-10-CM

## 2022-02-02 DIAGNOSIS — Z1239 Encounter for other screening for malignant neoplasm of breast: Secondary | ICD-10-CM

## 2022-02-02 NOTE — Patient Instructions (Addendum)
Explained breast self awareness with Darleene Cleaver. Pap smear is due. Patient refused Pap smear today. Patient stated she would like to have her Pap smear completed at her PCP's office. Let her know [facility] will cover Pap smears and HPV typing every 5 years unless has a history of abnormal Pap smears. Referred patient to the [facility] for a diagnostic mammogram. Appointment scheduled Tuesday, February 02, 2022 at 1240. Patient aware of appointment and will be there. [place] Giammona verbalized understanding.  Bartosz Luginbill, Arvil Chaco, RN 11:31 AM

## 2022-02-02 NOTE — Progress Notes (Signed)
[name] is a 51 y.o. female who presents to [facility] clinic today with complaint of left diffuse dull/cramp like breast pain x 4 months that comes and goes. Patient rates the pain at a 6 out of 10.    Pap Smear: Pap smear not completed today. Last Pap smear was 01/23/2022 at [facility] clinic in [place], [place] and was normal with negative HPV per patient. Per patient has no history of an abnormal Pap smear. Last Pap smear result is not available in Epic.   Physical exam: Breasts Breasts symmetrical. No skin abnormalities bilateral breasts. No nipple retraction bilateral breasts. No nipple discharge bilateral breasts. No lymphadenopathy. No lumps palpated bilateral breasts. No complaints of pain or tenderness on exam.       Pelvic/Bimanual Pap smear is due. Patient refused Pap smear today. Patient stated she would like to have her Pap smear completed at her PCP's office.    Smoking History: Patient is a former smoker that quit 6 years ago.   Patient Navigation: Patient education provided. Access to services provided for patient through [facility] program.   Colorectal Cancer Screening: Per patient has never had colonoscopy completed. FIT Test completed 12/03/2021 that was negative. No complaints today.    Breast and Cervical Cancer Risk Assessment: Patient has family history of a paternal aunt having breast cancer. Patient has no known genetic mutations or history of radiation treatment to the chest before age 49. Patient does not have history of cervical dysplasia, immunocompromised, or DES exposure in-utero.  Risk Assessment     Risk Scores       02/02/2022   Last edited by: Royston Bake, CMA   5-year risk: 1.2 %   Lifetime risk: 9.8 %            A: [facility] exam without pap smear Complaint of left breast pain.  P: Referred patient to the [facility] for a diagnostic mammogram. Appointment scheduled Tuesday, February 02, 2022 at  1240.  Loletta Parish, RN 02/02/2022 11:31 AM

## 2022-03-01 ENCOUNTER — Other Ambulatory Visit: Payer: Self-pay

## 2022-03-01 ENCOUNTER — Other Ambulatory Visit (HOSPITAL_COMMUNITY)
Admission: RE | Admit: 2022-03-01 | Discharge: 2022-03-01 | Disposition: A | Discharge status: Home / Self Care | Payer: Medicaid Other | Source: Ambulatory Visit | Attending: Internal Medicine | Admitting: Internal Medicine

## 2022-03-01 ENCOUNTER — Encounter: Payer: Self-pay | Admitting: Internal Medicine

## 2022-03-01 ENCOUNTER — Ambulatory Visit: Payer: No Typology Code available for payment source | Attending: Internal Medicine | Admitting: Internal Medicine

## 2022-03-01 VITALS — BP 119/80 | HR 67 | Ht 66.0 in | Wt 205.0 lb

## 2022-03-01 DIAGNOSIS — Z23 Encounter for immunization: Secondary | ICD-10-CM

## 2022-03-01 DIAGNOSIS — Z124 Encounter for screening for malignant neoplasm of cervix: Secondary | ICD-10-CM

## 2022-03-01 DIAGNOSIS — N644 Mastodynia: Secondary | ICD-10-CM

## 2022-03-01 MED ORDER — ZOSTER VAC RECOMB ADJUVANTED 50 MCG/0.5ML IM SUSR
0.5000 mL | Freq: Once | INTRAMUSCULAR | 0 refills | Status: AC
Start: 2022-03-01 — End: 2022-03-02

## 2022-03-01 NOTE — Progress Notes (Signed)
Patient ID: JEREMIE ABDELAZIZ, female    DOB: 03-20-1971  MRN: [id number]  CC: PAP  Subjective: Monica Mcfarland is a 51 y.o. female who presents for GYN/PAP Her concerns today include:  DM type II, HL, obesity   GYN History:  Pt is G5P4 (1 aborted) Any hx of abn paps?: no Menses regular or irregular?:  postmenopausal x 7 yrs How long does menses last? NA Menstrual flow light or heavy?: NA Method of birth control?:  NA Any vaginal dischg at this time?:  no Dysuria?: no Any hx of STI?: yes GN over 10 yrs ago - treated Sexually active with how many partners: currently not sexually active x 4 yrs Desires STI screen: yes Last MMG: 02/22/2022.  Benign cyst LT breast.  Pt reports she gets pain in LT breast about 1-2 times a wk that can last up to 1 hr.   Family hx of uterine, cervical or breast cancer?:  sister had cervical CA  Patient Active Problem List   Diagnosis Date Noted   Hyperlipidemia associated with type 2 diabetes mellitus ([place]) 07/01/2020   Influenza vaccine needed 04/01/2020   Type 2 diabetes mellitus with obesity ([place]) 07/15/2019     Current Outpatient Medications on File Prior to Visit  Medication Sig Dispense Refill   Aspirin 81 MG CAPS Take 4 tablets by mouth daily. For chest pain (Patient not taking: Reported on 12/04/2021)     Blood Glucose Monitoring Suppl (TRUE METRIX METER) w/Device KIT Use to check blood sugars once per day either fasting/2 hours after a meal or before bedtime 1 kit 0   glucose blood (TRUE METRIX BLOOD GLUCOSE TEST) test strip Use as instructed to check blood sugar once per day 100 each 3   metFORMIN (GLUCOPHAGE-XR) 500 MG 24 hr tablet Take 2 tablets (1,000 mg total) by mouth 2 (two) times daily. TAKE 2 TABLETS BY MOUTH TWICE DAILY WITH A MEAL. 360 tablet 1   rosuvastatin (CRESTOR) 10 MG tablet TAKE 1 TABLET(10 MG) BY MOUTH DAILY 90 tablet 0   TRUEplus Lancets 28G MISC Use once daily when checking blood sugars 100 each 3   No current  facility-administered medications on file prior to visit.    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, rash, hives   Shellfish Allergy Anaphylaxis    Social History   Socioeconomic History   Marital status: Single    Spouse name: Not on file   Number of children: Not on file   Years of education: Not on file   Highest education level: Not on file  Occupational History   Not on file  Tobacco Use   Smoking status: Former    Types: Cigarettes    Start date: 07/12/2015   Smokeless tobacco: Never  Substance and Sexual Activity   Alcohol use: Not Currently   Drug use: Not Currently   Sexual activity: Not Currently  Other Topics Concern   Not on file  Social History Narrative   Not on file   Social Determinants of Health   Financial Resource Strain: Not on file  Food Insecurity: No Food Insecurity (02/02/2022)   Hunger Vital Sign    Worried About Running Out of Food in the Last Year: Never true    Ran Out of Food in the Last Year: Never true  Transportation Needs: No Transportation Needs (02/02/2022)   PRAPARE - Hydrologist (Medical): No  Lack of Transportation (Non-Medical): No  Physical Activity: Not on file  Stress: Not on file  Social Connections: Not on file  Intimate Partner Violence: Not on file    Family History  Problem Relation Age of Onset   Diabetes Mother    Heart disease Mother    Diabetes Father    Diabetes Sister    Breast cancer Paternal Aunt    Hearing loss Maternal Grandmother    Hearing loss Maternal Grandfather     Past Surgical History:  Procedure Laterality Date   CESAREAN SECTION     3 children     ROS: Review of Systems Negative except as stated above  PHYSICAL EXAM: BP 119/80 (BP Location: Left Arm, Patient Position: Sitting, Cuff Size: Normal)   Pulse 67   Ht 5' 6" (1.676 m)   Wt 205 lb (93 kg)   SpO2 99%   BMI 33.09 kg/m   Physical  Exam  General appearance - alert, well appearing, and in no distress Mental status - normal mood, behavior, speech, dress, motor activity, and thought processes Breasts - CMA Carly Mack present:  breasts appear normal, no suspicious masses, no skin or nipple changes or axillary nodes Pelvic - Leamon Arnt, CMA present:  no external vaginal lesions.  Small amount clear dischg in vault.  Cervix is stenosed.  NO CMT. Adnexal masses.  Uterus nl size      Latest Ref Rng & Units 12/04/2021    4:33 PM 03/02/2021   12:03 PM 07/01/2020   11:37 AM  CMP  Glucose 70 - 99 mg/dL 95  111    BUN 6 - 24 mg/dL 13  12    Creatinine 0.57 - 1.00 mg/dL 0.65  0.76    Sodium 134 - 144 mmol/L 140  142    Potassium 3.5 - 5.2 mmol/L 3.9  4.8    Chloride 96 - 106 mmol/L 102  102    CO2 20 - 29 mmol/L 25  26    Calcium 8.7 - 10.2 mg/dL 9.4  9.5    Total Protein 6.0 - 8.5 g/dL 7.7   7.5   Total Bilirubin 0.0 - 1.2 mg/dL 0.9   0.9   Alkaline Phos 44 - 121 IU/L 80   81   AST 0 - 40 IU/L 20   18   ALT 0 - 32 IU/L 11   10    Lipid Panel     Component Value Date/Time   CHOL 130 03/02/2021 1203   TRIG 100 03/02/2021 1203   HDL 69 03/02/2021 1203   CHOLHDL 1.9 03/02/2021 1203   LDLCALC 43 03/02/2021 1203    CBC    Component Value Date/Time   WBC 6.7 12/04/2021 1633   WBC 6.6 11/23/2019 1415   RBC 4.15 12/04/2021 1633   RBC 4.30 11/23/2019 1415   HGB 12.5 12/04/2021 1633   HCT 37.2 12/04/2021 1633   PLT 189 12/04/2021 1633   MCV 90 12/04/2021 1633   MCH 30.1 12/04/2021 1633   MCH 30.5 11/23/2019 1415   MCHC 33.6 12/04/2021 1633   MCHC 32.7 11/23/2019 1415   RDW 12.3 12/04/2021 1633    ASSESSMENT AND PLAN: 1. Pap smear for cervical cancer screening - Cytology - PAP - Cervicovaginal ancillary only  2. Mastalgia in female Breast exam benign and mammogram with ultrasound that revealed benign cysts in the left breast.  Advised patient that we can wait and do her repeat mammogram in 1 year or can refer  her to the breast clinic given symptoms of breast pain.  She is currently uninsured and does not have the orange card/cone discount.  I have told her to apply for the cone discount and once approved she can let me know  3. Need for immunization against influenza - Flu Vaccine QUAD 70moIM (Fluarix, Fluzone & Alfiuria Quad PF)  4. Need for shingles vaccine Patient agreeable to receiving shingles vaccine.  Printed prescription for Shingrix for her to take downstairs to our pharmacy for the first shot. - Zoster Vaccine Adjuvanted ([facility] injection; Inject 0.5 mLs into the muscle once for 1 dose.  Dispense: 0.5 mL; Refill: 0     Patient was given the opportunity to ask questions.  Patient verbalized understanding of the plan and was able to repeat key elements of the plan.   This documentation was completed using DRadio producer  Any transcriptional errors are unintentional.  No orders of the defined types were placed in this encounter.    Requested Prescriptions    No prescriptions requested or ordered in this encounter    No follow-ups on file.  DKarle Plumber MD, FACP

## 2022-03-02 ENCOUNTER — Other Ambulatory Visit: Payer: Self-pay

## 2022-03-02 LAB — CERVICOVAGINAL ANCILLARY ONLY
Bacterial Vaginitis (gardnerella): NEGATIVE
Candida Glabrata: NEGATIVE
Candida Vaginitis: NEGATIVE
Chlamydia: NEGATIVE
Comment: NEGATIVE
Comment: NEGATIVE
Comment: NEGATIVE
Comment: NEGATIVE
Comment: NEGATIVE
Comment: NORMAL
Neisseria Gonorrhea: NEGATIVE
Trichomonas: NEGATIVE

## 2022-03-04 LAB — CYTOLOGY - PAP
Comment: NEGATIVE
Diagnosis: NEGATIVE
High risk HPV: NEGATIVE

## 2022-03-08 ENCOUNTER — Other Ambulatory Visit: Payer: Self-pay

## 2022-03-09 ENCOUNTER — Other Ambulatory Visit: Payer: Self-pay

## 2022-03-14 ENCOUNTER — Other Ambulatory Visit: Payer: Self-pay | Admitting: Internal Medicine

## 2022-03-14 DIAGNOSIS — E119 Type 2 diabetes mellitus without complications: Secondary | ICD-10-CM

## 2022-03-15 NOTE — Telephone Encounter (Signed)
Requested Prescriptions  Pending Prescriptions Disp Refills   rosuvastatin (CRESTOR) 10 MG tablet [Pharmacy Med Name: ROSUVASTATIN 10MG  TABLETS] 90 tablet 0    Sig: TAKE 1 TABLET(10 MG) BY MOUTH DAILY     Cardiovascular:  Antilipid - Statins 2 Failed - 03/14/2022  9:13 AM      Failed - Lipid Panel in normal range within the last 12 months    Cholesterol, Total  Date Value Ref Range Status  03/02/2021 130 100 - 199 mg/dL Final   LDL Chol Calc (NIH)  Date Value Ref Range Status  03/02/2021 43 0 - 99 mg/dL Final   HDL  Date Value Ref Range Status  03/02/2021 69 >39 mg/dL Final   Triglycerides  Date Value Ref Range Status  03/02/2021 100 0 - 149 mg/dL Final         Passed - Cr in normal range and within 360 days    Creatinine, Ser  Date Value Ref Range Status  12/04/2021 0.65 0.57 - 1.00 mg/dL Final         Passed - Patient is not pregnant      Passed - Valid encounter within last 12 months    Recent Outpatient Visits           2 weeks ago Pap smear for cervical cancer screening   [facility] 02/03/2022 B, MD   3 months ago Type 2 diabetes mellitus with obesity (HCC)   [facility] Jonah Blue B, MD   6 months ago Type 2 diabetes mellitus with obesity [facility])   [place] [facility], MD   1 year ago Type 2 diabetes mellitus with obesity [facility])   [place] [facility], MD   1 year ago Type 2 diabetes mellitus with obesity [facility])   [place] [facility], MD       Future Appointments             In 3 months Marcine Matar, Laural Benes, MD [facility]

## 2022-03-31 ENCOUNTER — Telehealth: Payer: Self-pay | Admitting: Emergency Medicine

## 2022-03-31 DIAGNOSIS — N644 Mastodynia: Secondary | ICD-10-CM

## 2022-03-31 DIAGNOSIS — N6002 Solitary cyst of left breast: Secondary | ICD-10-CM

## 2022-03-31 NOTE — Telephone Encounter (Signed)
Left message on voicemail to return call.

## 2022-03-31 NOTE — Telephone Encounter (Signed)
Copied from CRM [id number]. Topic: Referral - Question >> Mar 30, 2022  3:06 PM Monica Mcfarland wrote: Pt called saying she had a mammogram and it showed a small cyst in one on her breast.  She would like to see a surgeon to get it removed.  CB@  [phone]

## 2022-03-31 NOTE — Addendum Note (Signed)
Addended by: Jonah Blue B on: 03/31/2022 09:24 AM   Modules accepted: Orders

## 2022-04-01 LAB — HM DIABETES EYE EXAM

## 2022-04-07 NOTE — Telephone Encounter (Signed)
Called & spoke to the patient. Informed of message below. Patient expressed understanding.

## 2022-04-08 ENCOUNTER — Other Ambulatory Visit: Payer: Self-pay

## 2022-05-28 NOTE — Addendum Note (Signed)
Addended by: Karle Plumber B on: 05/28/2022 01:31 PM   Modules accepted: Orders

## 2022-05-28 NOTE — Telephone Encounter (Signed)
Spoke with Monica Mcfarland from [employer]. Informed Monica Mcfarland that since she was not on patient medical release that I could not discuss patient medical information. Monica Mcfarland placed patient on the call. Patient verified by her Birthday. Informed patient that she did not have a surgical referral  noted in her chart. Patient voiced that she thought she did because of the cyst that was found during here last breast exam. Reviewed with patient findings of  breast exam Per report "There is a cyst in the left breast of no significance. No cause for the patient's left-sided symptoms identified. No evidence of malignancy in either breast."  Also review that report states that the results had been discussed with her previously. Patient voiced that she was called and results were discussed. She voiced she thought that was told that she would have a surgeon call her to discuss removal of the cyst.  Patient offered an appointment with PCP to further address her concern. Patient declined. Voiced that she did not need to see PCP, she would just schedule her annual mammogram as recommended.

## 2022-05-28 NOTE — Telephone Encounter (Signed)
Spoke with Patient. Per Dr. Patrcia Dolly patient know that I will submit a referral for her to see the surgeon for her left breast cyst if she wants to have this removed.  Referral submitted. "   Patient voiced understanding of all discussed .

## 2022-05-28 NOTE — Telephone Encounter (Signed)
Vicente Males calling from [employer] where the patient lives and takes care of all of the medical care for patients. Wanting to know what surgeon that the patient has been referred to? Small cyst has been found. Please advise CB- [phone] Cell,  [phone]-Office

## 2022-05-31 NOTE — Telephone Encounter (Signed)
Noted  

## 2022-06-18 ENCOUNTER — Other Ambulatory Visit: Payer: Self-pay | Admitting: Internal Medicine

## 2022-06-18 DIAGNOSIS — E119 Type 2 diabetes mellitus without complications: Secondary | ICD-10-CM

## 2022-06-18 NOTE — Telephone Encounter (Signed)
Requested Prescriptions  Pending Prescriptions Disp Refills   rosuvastatin (CRESTOR) 10 MG tablet [Pharmacy Med Name: ROSUVASTATIN 10MG TABLETS] 90 tablet 0    Sig: TAKE 1 TABLET(10 MG) BY MOUTH DAILY     Cardiovascular:  Antilipid - Statins 2 Failed - 06/18/2022  3:33 AM      Failed - Lipid Panel in normal range within the last 12 months    Cholesterol, Total  Date Value Ref Range Status  03/02/2021 130 100 - 199 mg/dL Final   LDL Chol Calc (NIH)  Date Value Ref Range Status  03/02/2021 43 0 - 99 mg/dL Final   HDL  Date Value Ref Range Status  03/02/2021 69 >39 mg/dL Final   Triglycerides  Date Value Ref Range Status  03/02/2021 100 0 - 149 mg/dL Final         Passed - Cr in normal range and within 360 days    Creatinine, Ser  Date Value Ref Range Status  12/04/2021 0.65 0.57 - 1.00 mg/dL Final         Passed - Patient is not pregnant      Passed - Valid encounter within last 12 months    Recent Outpatient Visits           3 months ago Pap smear for cervical cancer screening   [place], [place]   6 months ago Type 2 diabetes mellitus with obesity [facility])   [place] Karle Plumber B, MD   9 months ago Type 2 diabetes mellitus with obesity [facility])   [place] Karle Plumber B, MD   1 year ago Type 2 diabetes mellitus with obesity [facility])   [place] Karle Plumber B, MD   1 year ago Type 2 diabetes mellitus with obesity [facility])   [place], MD       Future Appointments             In 1 week Ladell Pier, MD [place]

## 2022-06-22 ENCOUNTER — Telehealth: Payer: Self-pay | Admitting: Internal Medicine

## 2022-06-22 ENCOUNTER — Ambulatory Visit: Payer: Self-pay | Admitting: *Deleted

## 2022-06-22 NOTE — Telephone Encounter (Signed)
  Chief Complaint: lump/bump on labia Symptoms: left labia Frequency: started yesterday  Pertinent Negatives: Patient denies pain- but is uncomfortable Disposition: []$ ED /[]$ Urgent Care (no appt availability in office) / [x]$ Appointment(In office/virtual)/ []$  [place] [facility]/ []$ Home Care/ []$ Refused Recommended Disposition /[]$ [place] [facility]/ []$  Follow-up with PCP Additional Notes: Patient noticed bump yesterday- has decreased in size - but is more uncomfortable today. Reached out to office and patient has been scheduled. Appointment has been scheduled- patient advised warm compress and be seen if gets worse before appointment.

## 2022-06-22 NOTE — Telephone Encounter (Signed)
Copied from [place] [id number]. Topic: Referral - Request for Referral >> Jun 22, 2022 11:09 AM Erskine Squibb wrote: Has patient seen PCP for this complaint? No. Referral for which specialty: Ob/gyn Preferred provider/office: Ob/gyn in [place] that takes Medicaid Reason for referral: Vaginal swelling and discomfort/ Huge knot  Patient has requested referral be placed as soon as possible as she is very uncomfortable. Patient does have an appt with her provider but not until next week and is on the wait list.Please assist patient further

## 2022-06-22 NOTE — Telephone Encounter (Signed)
Summary: Vignal swelling   The patient has had vaginal swelling since yesterday. She said there is a noticeable knot there and she is uncomfortable. She did call in requesting a referral to an ob/gyn as she does not currently have one. She also has an appt with her provider next week and is on the wait list. Please assist patient further.         Reason for Disposition . Tender lump (swelling or "ball") at vaginal opening    Tender lump on vulva- possible cyst  Answer Assessment - Initial Assessment Questions 1. SYMPTOM: "What's the main symptom you're concerned about?" (e.g., pain, itching, dryness)     Cystic lump- labia- left 2. LOCATION: "Where is the cyst located?" (e.g., inside/outside, left/right)     Left labia 3. ONSET: "When did the  cyst  start?"     yesterday 4. PAIN: "Is there any pain?" If Yes, ask: "How bad is it?" (Scale: 1-10; mild, moderate, severe)   -  MILD (1-3): Doesn't interfere with normal activities.    -  MODERATE (4-7): Interferes with normal activities (e.g., work or school) or awakens from sleep.     -  SEVERE (8-10): Excruciating pain, unable to do any normal activities.     Uncomfortable- 4 5. ITCHING: "Is there any itching?" If Yes, ask: "How bad is it?" (Scale: 1-10; mild, moderate, severe)     no 6. CAUSE: "What do you think is causing the discharge?" "Have you had the same problem before? What happened then?"     Cyst possible 7. OTHER SYMPTOMS: "Do you have any other symptoms?" (e.g., fever, itching, vaginal bleeding, pain with urination, injury to genital area, vaginal foreign body)     no  Answer Assessment - Initial Assessment Questions 1. APPEARANCE of SWELLING: "What does it look like?"     Small bump- left labia 2. SIZE: "How large is the swelling?" (e.g., inches, cm; or compare to size of pinhead, tip of pen, eraser, coin, pea, grape, ping pong ball)      Smaller today than yesterday 3. LOCATION: "Where is the swelling located?"     Left  labia 4. ONSET: "When did the swelling start?"     Noticed yesterday  6. PAIN: "Is there any pain?" If Yes, ask: "How bad is the pain?" (e.g., scale 1-10; or mild, moderate, severe)     - NONE (0): no pain   - MILD (1-3): doesn't interfere with normal activities    - MODERATE (4-7): interferes with normal activities or awakens from sleep    - SEVERE (8-10): excruciating pain, unable to do any normal activities     Mild- more uncomfortable 7. ITCH: "Does it itch?" If Yes, ask: "How bad is the itch?"      no 8. CAUSE: "What do you think caused the swelling?"      Possible cyst 9 OTHER SYMPTOMS: "Do you have any other symptoms?" (e.g., fever)     none  Protocols used: Vaginal Symptoms-A-AH, Skin Lump or Localized Swelling-A-AH, Vulvar Symptoms-A-AH

## 2022-06-23 ENCOUNTER — Ambulatory Visit (HOSPITAL_COMMUNITY)
Admission: EM | Admit: 2022-06-23 | Discharge: 2022-06-23 | Disposition: A | Discharge status: Home / Self Care | Payer: Medicaid Other | Attending: Emergency Medicine | Admitting: Emergency Medicine

## 2022-06-23 DIAGNOSIS — L02215 Cutaneous abscess of perineum: Secondary | ICD-10-CM

## 2022-06-23 DIAGNOSIS — L02818 Cutaneous abscess of other sites: Secondary | ICD-10-CM

## 2022-06-23 DIAGNOSIS — E1165 Type 2 diabetes mellitus with hyperglycemia: Secondary | ICD-10-CM

## 2022-06-23 MED ORDER — SULFAMETHOXAZOLE-TRIMETHOPRIM 800-160 MG PO TABS: 1.0000 | ORAL_TABLET | Freq: Two times a day (BID) | ORAL | 0 refills | Status: AC

## 2022-06-23 NOTE — Discharge Instructions (Signed)
You appear to have an abscess.  It is not ready for drainage at this point.  Please start her on Bactrim twice daily for the next 7 days, you can take this with food to help minimize gastrointestinal upset.  Please use a warm compress and Epsom salt baths throughout the day.  You can alternate between Tylenol and ibuprofen for pain and discomfort.  If the area does not resolve after antibiotics, if it comes to the surface and becomes fluctuant, or persist please follow-up with your primary care, your GYN, or this clinic.  Please seek immediate care if you develop fever, streaking, or worsening of symptoms.

## 2022-06-23 NOTE — ED Triage Notes (Signed)
Possible abscess on vagina causing pain and discomfort x 3days .

## 2022-06-23 NOTE — ED Provider Notes (Signed)
[facility]    CSN: [id number] Arrival date & time: 06/23/22  [id number]      History   Chief Complaint No chief complaint on file.   HPI Monica Mcfarland is a 52 y.o. female.   Reports to clinic for 2 days of potential abscess on her right labia majora.  Reports on Monday she noticed the area and it was hard and tender.  Since then it has improved, but is still present.  Denies fevers, denies drainage, denies concerns for sexually transmitted infections.  Tried 1 dose of ibuprofen with minimal relief.  She is a type II diabetic controlled on metformin.  Patient does not shave and wears cotton underwear.  Normal renal function as of 12/2021    The history is provided by the patient.    No past medical history on file.  Patient Active Problem List   Diagnosis Date Noted   Hyperlipidemia associated with type 2 diabetes mellitus ([place]) 07/01/2020   Influenza vaccine needed 04/01/2020   Type 2 diabetes mellitus with obesity ([place]) 07/15/2019    Past Surgical History:  Procedure Laterality Date   CESAREAN SECTION     3 children     OB History   No obstetric history on file.      Home Medications    Prior to Admission medications   Medication Sig Start Date End Date Taking? Authorizing Provider  Aspirin 81 MG CAPS Take 4 tablets by mouth daily. For chest pain   Yes [provider]  Blood Glucose Monitoring Suppl (TRUE METRIX METER) w/Device KIT Use to check blood sugars once per day either fasting/2 hours after a meal or before bedtime 07/12/19  Yes Fulp, Cammie, MD  glucose blood (TRUE METRIX BLOOD GLUCOSE TEST) test strip Use as instructed to check blood sugar once per day 07/12/19  Yes Fulp, Cammie, MD  metFORMIN (GLUCOPHAGE-XR) 500 MG 24 hr tablet Take 2 tablets (1,000 mg total) by mouth 2 (two) times daily. TAKE 2 TABLETS BY MOUTH TWICE DAILY WITH A MEAL. 12/16/21  Yes Ladell Pier, MD  rosuvastatin (CRESTOR) 10 MG tablet TAKE 1 TABLET(10 MG) BY  MOUTH DAILY 06/18/22  Yes Ladell Pier, MD  sulfamethoxazole-trimethoprim (BACTRIM DS) 800-160 MG tablet Take 1 tablet by mouth 2 (two) times daily for 7 days. 06/23/22 06/30/22 Yes Baylie Drakes, [place] N, FNP  TRUEplus Lancets 28G MISC Use once daily when checking blood sugars 07/12/19   Antony Blackbird, MD    Family History Family History  Problem Relation Age of Onset   Diabetes Mother    Heart disease Mother    Diabetes Father    Diabetes Sister    Breast cancer Paternal Aunt    Hearing loss Maternal Grandmother    Hearing loss Maternal Grandfather     Social History Social History   Tobacco Use   Smoking status: Former    Types: Cigarettes    Start date: 07/12/2015   Smokeless tobacco: Never  Substance Use Topics   Alcohol use: Not Currently   Drug use: Not Currently     Allergies   Other and Shellfish allergy   Review of Systems Review of Systems  Constitutional:  Negative for fever.  Respiratory:  Negative for cough.   Cardiovascular:  Negative for chest pain.  Genitourinary:  Negative for dysuria, vaginal discharge and vaginal pain.     Physical Exam Triage Vital Signs ED Triage Vitals  Enc Vitals Group     BP 06/23/22 1114 119/80  Pulse Rate 06/23/22 1114 76     Resp 06/23/22 1114 12     Temp 06/23/22 1114 98 F (36.7 C)     Temp Source 06/23/22 1114 Oral     SpO2 06/23/22 1114 97 %     Weight --      Height --      Head Circumference --      Peak Flow --      Pain Score 06/23/22 1117 5     Pain Loc --      Pain Edu? --      Excl. in [place]? --    No data found.  Updated Vital Signs BP 119/80 (BP Location: Right Arm)   Pulse 76   Temp 98 F (36.7 C) (Oral)   Resp 12   SpO2 97%   Visual Acuity Right Eye Distance:   Left Eye Distance:   Bilateral Distance:    Right Eye Near:   Left Eye Near:    Bilateral Near:     Physical Exam Vitals and nursing note reviewed. Exam conducted with a chaperone present.  Constitutional:       General: She is not in acute distress.    Appearance: Normal appearance. She is not ill-appearing.     Comments: Pleasant 52 year old female who appears stated age.  HENT:     Head: Normocephalic and atraumatic.     Right Ear: External ear normal.     Left Ear: External ear normal.  Eyes:     General: Lids are normal.  Cardiovascular:     Rate and Rhythm: Normal rate.  Pulmonary:     Effort: Pulmonary effort is normal.  Genitourinary:    General: Normal vulva.     Pubic Area: No rash.      Labia:        Right: Tenderness present. No rash.        Left: No rash or tenderness.      Vagina: No vaginal discharge.       Comments: Is an approximately 3 cm ovular area in the right mons pubis, close to her right upper labia majora.  Area is mobile and indurated, without fluctuance.  Overlying skin without wreaking, erythema or warmth.  Reports area is tender to palpation. Neurological:     Mental Status: She is alert.  Psychiatric:        Behavior: Behavior is cooperative.      UC Treatments / Results  Labs (all labs ordered are listed, but only abnormal results are displayed) Labs Reviewed - No data to display  EKG   Radiology No results found.  Procedures Procedures (including critical care time)  Medications Ordered in UC Medications - No data to display  Initial Impression / Assessment and Plan / UC Course  I have reviewed the triage vital signs and the nursing notes.  Pertinent labs & imaging results that were available during my care of the patient were reviewed by me and considered in my medical decision making (see chart for details).  Vitals and triage note reviewed.  Patient is hemodynamically stable, afebrile.  Suspect abscess to mons pubis.  Area is indurated, nonfluctuant, overlying skin without erythema, streaking or head to abscess.  Area would not benefit from incision and drainage at this point.  Will cover with Bactrim BID x 7 days, advised warm Epsom  salt baths, warm compress, Tylenol and ibuprofen as needed.  Follow-up precautions discussed, patient verbalized understanding.     Final Clinical  Impressions(s) / UC Diagnoses   Final diagnoses:  Cutaneous abscess of other site  Type 2 diabetes mellitus with hyperglycemia, without long-term current use of insulin ([place])     Discharge Instructions      You appear to have an abscess.  It is not ready for drainage at this point.  Please start her on Bactrim twice daily for the next 7 days, you can take this with food to help minimize gastrointestinal upset.  Please use a warm compress and Epsom salt baths throughout the day.  You can alternate between Tylenol and ibuprofen for pain and discomfort.  If the area does not resolve after antibiotics, if it comes to the surface and becomes fluctuant, or persist please follow-up with your primary care, your GYN, or this clinic.  Please seek immediate care if you develop fever, streaking, or worsening of symptoms.     ED Prescriptions     Medication Sig Dispense Auth. Provider   sulfamethoxazole-trimethoprim (BACTRIM DS) 800-160 MG tablet Take 1 tablet by mouth 2 (two) times daily for 7 days. 14 tablet Kodah Maret, [place] N, [place]      I have reviewed the PDMP during this encounter.   Jennel Mara, [place] N, [place] 06/23/22 1144

## 2022-06-24 ENCOUNTER — Ambulatory Visit: Payer: Medicaid Other | Admitting: Internal Medicine

## 2022-06-24 NOTE — Telephone Encounter (Signed)
Called & spoke to the patient. Verified name & DOB. Patient stated that she went ahead to [facility] and was seen for her concerns. No further questions at this time. Confirmed next appointment scheduled with Dr.Johnson on 07/01/2022.

## 2022-07-01 ENCOUNTER — Ambulatory Visit: Payer: Medicaid Other | Attending: Internal Medicine | Admitting: Internal Medicine

## 2022-07-01 ENCOUNTER — Encounter: Payer: Self-pay | Admitting: Internal Medicine

## 2022-07-01 VITALS — BP 99/66 | HR 65 | Temp 98.0°F | Ht 66.0 in | Wt 198.0 lb

## 2022-07-01 DIAGNOSIS — Z7984 Long term (current) use of oral hypoglycemic drugs: Secondary | ICD-10-CM

## 2022-07-01 DIAGNOSIS — Z6831 Body mass index (BMI) 31.0-31.9, adult: Secondary | ICD-10-CM | POA: Diagnosis not present

## 2022-07-01 DIAGNOSIS — F1491 Cocaine use, unspecified, in remission: Secondary | ICD-10-CM

## 2022-07-01 DIAGNOSIS — E1169 Type 2 diabetes mellitus with other specified complication: Secondary | ICD-10-CM | POA: Diagnosis not present

## 2022-07-01 DIAGNOSIS — Z23 Encounter for immunization: Secondary | ICD-10-CM

## 2022-07-01 DIAGNOSIS — L720 Epidermal cyst: Secondary | ICD-10-CM | POA: Diagnosis not present

## 2022-07-01 DIAGNOSIS — E669 Obesity, unspecified: Secondary | ICD-10-CM

## 2022-07-01 DIAGNOSIS — E785 Hyperlipidemia, unspecified: Secondary | ICD-10-CM | POA: Diagnosis not present

## 2022-07-01 DIAGNOSIS — F320 Major depressive disorder, single episode, mild: Secondary | ICD-10-CM

## 2022-07-01 LAB — POCT GLYCOSYLATED HEMOGLOBIN (HGB A1C): HbA1c, POC (controlled diabetic range): 6 % (ref 0.0–7.0)

## 2022-07-01 LAB — GLUCOSE, POCT (MANUAL RESULT ENTRY): POC Glucose: 117 mg/dl — AB (ref 70–99)

## 2022-07-01 MED ORDER — METFORMIN HCL ER 500 MG PO TB24: 500.0000 mg | ORAL_TABLET | Freq: Every day | ORAL | 1 refills | Status: AC

## 2022-07-01 MED ORDER — ROSUVASTATIN CALCIUM 10 MG PO TABS: 10.0000 mg | ORAL_TABLET | Freq: Every day | ORAL | 1 refills | Status: AC

## 2022-07-01 NOTE — Progress Notes (Signed)
Patient ID: Monica Mcfarland, female    DOB: Aug 24, 1970  MRN: [id number]  CC: Diabetes (DM f/u. Med refills./Abscess on vaginal area X10 days - has been treated with medication but still feeling uncomfortable/Already received flu vax. )   Subjective: Monica Mcfarland is a 52 y.o. female who presents for chronic disease management Her concerns today include:  DM type II, HL, obesity   Patient seen at [facility] 06/23/2022 for bump on right labia.  Assessed to have small abscess on mons pubis.  It was thought that I&D not needed.  Prescribed Bactrim x 7 days and advised to do warm bath soaks and compression. Completed abx yesterday; gone done a little but still there and feeling uncomfortable. -also has a small spot on her LT lower back x 4 mths.  Thought it may have been bug bite at first.  No discomfort or pain.  Slight increase in size  DM: Reports compliance with metformin XR 500 mg once a day.  Dose on Metformin XR on chart is 1 gram BID Does okay with eating habits.  Down 7 pounds since last visit. Exercise: try to walk around the block 3 x/wk Reports eye exam done 3 mths ago at [facility], no retinopathy.  Has new glasses HL: Taking and tolerating Crestor.  Pos Dep screen:  not sleeping well; problem focusing on anything for a long period.  "I like watching TV but I lose interest really fast."  Started around christmas. 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  She has been there 4 yrs.  Clean of crack cocaine x 4 yrs.   HM: Given prescription to get Shingrix vaccine on last visit.  Did not get it.  Agreeable to receiving today now that she has insurance. Patient Active Problem List   Diagnosis Date Noted   Hyperlipidemia associated with type 2 diabetes mellitus ([place]) 07/01/2020   Influenza vaccine needed  04/01/2020   Type 2 diabetes mellitus with obesity ([place]) 07/15/2019     Current Outpatient Medications on File Prior to Visit  Medication Sig Dispense Refill   Blood Glucose Monitoring Suppl (TRUE METRIX METER) w/Device KIT Use to check blood sugars once per day either fasting/2 hours after a meal or before bedtime 1 kit 0   glucose blood (TRUE METRIX BLOOD GLUCOSE TEST) test strip Use as instructed to check blood sugar once per day 100 each 3   metFORMIN (GLUCOPHAGE-XR) 500 MG 24 hr tablet Take 2 tablets (1,000 mg total) by mouth 2 (two) times daily. TAKE 2 TABLETS BY MOUTH TWICE DAILY WITH A MEAL. 360 tablet 1   rosuvastatin (CRESTOR) 10 MG tablet TAKE 1 TABLET(10 MG) BY MOUTH DAILY 90 tablet 0   TRUEplus Lancets 28G MISC Use once daily when checking blood sugars 100 each 3   Aspirin 81 MG CAPS Take 4 tablets by mouth daily. For chest pain (Patient not taking: Reported on 07/01/2022)     No current facility-administered medications on file prior to visit.    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, rash, hives   Shellfish Allergy Anaphylaxis    Social History   Socioeconomic History   Marital status: Single    Spouse name: Not  on file   Number of children: Not on file   Years of education: Not on file   Highest education level: Not on file  Occupational History   Not on file  Tobacco Use   Smoking status: Former    Types: Cigarettes    Start date: 07/12/2015   Smokeless tobacco: Never  Substance and Sexual Activity   Alcohol use: Not Currently   Drug use: Not Currently   Sexual activity: Not Currently  Other Topics Concern   Not on file  Social History Narrative   Not on file   Social Determinants of Health   Financial Resource Strain: Not on file  Food Insecurity: No Food Insecurity (02/02/2022)   Hunger Vital Sign    Worried About Running Out of Food in the Last Year: Never true    Ran Out of Food in the Last Year:  Never true  Transportation Needs: No Transportation Needs (02/02/2022)   PRAPARE - Hydrologist (Medical): No    Lack of Transportation (Non-Medical): No  Physical Activity: Not on file  Stress: Not on file  Social Connections: Not on file  Intimate Partner Violence: Not on file    Family History  Problem Relation Age of Onset   Diabetes Mother    Heart disease Mother    Diabetes Father    Diabetes Sister    Breast cancer Paternal Aunt    Hearing loss Maternal Grandmother    Hearing loss Maternal Grandfather     Past Surgical History:  Procedure Laterality Date   CESAREAN SECTION     3 children     ROS: Review of Systems Negative except as stated above  PHYSICAL EXAM: BP 99/66 (BP Location: Left Arm, Patient Position: Sitting, Cuff Size: Normal)   Pulse 65   Temp 98 F (36.7 C) (Oral)   Ht '5\' 6"'$  (1.676 m)   Wt 198 lb (89.8 kg)   SpO2 96%   BMI 31.96 kg/m   Wt Readings from Last 3 Encounters:  07/01/22 198 lb (89.8 kg)  03/01/22 205 lb (93 kg)  02/02/22 205 lb 3.2 oz (93.1 kg)    Physical Exam  General appearance - alert, well appearing, and in no distress Mental status - normal mood, behavior, speech, dress, motor activity, and thought processes Neck - supple, no significant adenopathy Chest - clear to auscultation, no wheezes, rales or rhonchi, symmetric air entry Heart - normal rate, regular rhythm, normal S1, S2, no murmurs, rubs, clicks or gallops Pelvic -CMA Clarissa present: No lesions noted on the mons pubis, the labia and the introitus. Extremities - peripheral pulses normal, no pedal edema, no clubbing or cyanosis Skin: Small less than 1 cm epidermoid cyst in the lower back close to the lumbar spine left side.  It is not tender to touch.  No erythema. Diabetic Foot Exam - Simple   Simple Foot Form Diabetic Foot exam was performed with the following findings: Yes 07/01/2022  9:15 AM  Visual Inspection No deformities, no  ulcerations, no other skin breakdown bilaterally: Yes Sensation Testing Intact to touch and monofilament testing bilaterally: Yes Pulse Check Posterior Tibialis and Dorsalis pulse intact bilaterally: Yes Comments        07/01/2022    8:51 AM 03/01/2022    8:43 AM 12/04/2021    3:31 PM  Depression screen PHQ 2/9  Decreased Interest 1 0 0  Down, Depressed, Hopeless 0 0 0  PHQ - 2 Score 1 0 0  Altered sleeping 3  3  Tired, decreased energy 3  0  Change in appetite 1  0  Feeling bad or failure about yourself  0  0  Trouble concentrating 3  0  Moving slowly or fidgety/restless 0  0  Suicidal thoughts 0  0  PHQ-9 Score 11  3      07/01/2022    8:51 AM 03/01/2022    8:43 AM 12/04/2021    3:31 PM 09/01/2021    3:28 PM  GAD 7 : Generalized Anxiety Score  Nervous, Anxious, on Edge 3 0 0 0  Control/stop worrying 2 0 0 0  Worry too much - different things 2 0 0 0  Trouble relaxing 3 0 0 1  Restless 3 0 0 0  Easily annoyed or irritable 0 0 0 0  Afraid - awful might happen 0 0 0 0  Total GAD 7 Score 13 0 0 1         Latest Ref Rng & Units 12/04/2021    4:33 PM 03/02/2021   12:03 PM 07/01/2020   11:37 AM  CMP  Glucose 70 - 99 mg/dL 95  111    BUN 6 - 24 mg/dL 13  12    Creatinine 0.57 - 1.00 mg/dL 0.65  0.76    Sodium 134 - 144 mmol/L 140  142    Potassium 3.5 - 5.2 mmol/L 3.9  4.8    Chloride 96 - 106 mmol/L 102  102    CO2 20 - 29 mmol/L 25  26    Calcium 8.7 - 10.2 mg/dL 9.4  9.5    Total Protein 6.0 - 8.5 g/dL 7.7   7.5   Total Bilirubin 0.0 - 1.2 mg/dL 0.9   0.9   Alkaline Phos 44 - 121 IU/L 80   81   AST 0 - 40 IU/L 20   18   ALT 0 - 32 IU/L 11   10    Lipid Panel     Component Value Date/Time   CHOL 130 03/02/2021 1203   TRIG 100 03/02/2021 1203   HDL 69 03/02/2021 1203   CHOLHDL 1.9 03/02/2021 1203   LDLCALC 43 03/02/2021 1203    CBC    Component Value Date/Time   WBC 6.7 12/04/2021 1633   WBC 6.6 11/23/2019 1415   RBC 4.15 12/04/2021 1633   RBC 4.30  11/23/2019 1415   HGB 12.5 12/04/2021 1633   HCT 37.2 12/04/2021 1633   PLT 189 12/04/2021 1633   MCV 90 12/04/2021 1633   MCH 30.1 12/04/2021 1633   MCH 30.5 11/23/2019 1415   MCHC 33.6 12/04/2021 1633   MCHC 32.7 11/23/2019 1415   RDW 12.3 12/04/2021 1633    ASSESSMENT AND PLAN: 1. Type 2 diabetes mellitus with obesity (H[facility]) At goal.  Continue metformin. Continue healthy eating habits and regular exercise. - POCT glucose (manual entry) - POCT glycosylated hemoglobin (Hb A1C) - Lipid panel - metFORMIN (GLUCOPHAGE-XR) 500 MG 24 hr tablet; Take 1 tablet (500 mg total) by mouth daily with breakfast. TAKE 2 TABLETS BY MOUTH TWICE DAILY WITH A MEAL.  Dispense: 90 tablet; Refill: 1 - rosuvastatin (CRESTOR) 10 MG tablet; Take 1 tablet (10 mg total) by mouth daily.  Dispense: 90 tablet; Refill: 1  2. Hyperlipidemia associated with type 2 diabetes mellitus ([place]) Due for repeat lipid profile.  Continue Crestor.  3. Epidermal cyst Discussed diagnosis.  We can observe for now unless it increases in size or becomes  bothersome  4. Mild major depression ([place]) Discussed management of depression.  Patient not interested in any counseling or medication at this time.  5. Cocaine use disorder in remission Commended her on this.  Encouraged her to remain free of street drugs  6. Need for shingles vaccine Discussed recommendation for administering vaccine for patients over the age of 2.  First Shingrix vaccine given today.  Advised that it can cause some redness and swelling at the injection site.  Looks like abscess that was in the vaginal area has resolved.   Patient was given the opportunity to ask questions.  Patient verbalized understanding of the plan and was able to repeat key elements of the plan.   This documentation was completed using Radio producer.  Any transcriptional errors are unintentional.  Orders Placed This Encounter  Procedures   POCT glucose  (manual entry)   POCT glycosylated hemoglobin (Hb A1C)     Requested Prescriptions    No prescriptions requested or ordered in this encounter    No follow-ups on file.  Karle Plumber, MD, FACP

## 2022-07-02 LAB — LIPID PANEL
Chol/HDL Ratio: 1.6 ratio (ref 0.0–4.4)
Cholesterol, Total: 105 mg/dL (ref 100–199)
HDL: 65 mg/dL (ref >39)
LDL Chol Calc (NIH): 28 mg/dL (ref 0–99)
Triglycerides: 49 mg/dL (ref 0–149)
VLDL Cholesterol Cal: 12 mg/dL (ref 5–40)

## 2022-07-12 ENCOUNTER — Telehealth: Payer: Self-pay | Admitting: Internal Medicine

## 2022-07-12 ENCOUNTER — Other Ambulatory Visit: Payer: Self-pay

## 2022-07-12 NOTE — Telephone Encounter (Signed)
Pt is calling because she saw Dr. Wynetta Emery a few weeks ago for an abscess and was prescribed an antibiotic. Pt says Dr. Wynetta Emery told her to call her if the abscess returned for further treatment. Please follow up with pt.

## 2022-07-12 NOTE — Telephone Encounter (Signed)
Patient call requesting appointment for abscess that has returned. Patient voices she has complete antibiotics. Appointment given for 07/15/2022 with PCP.

## 2022-07-15 ENCOUNTER — Encounter: Payer: Self-pay | Admitting: Internal Medicine

## 2022-07-15 ENCOUNTER — Ambulatory Visit: Payer: Medicaid Other | Attending: Internal Medicine | Admitting: Internal Medicine

## 2022-07-15 VITALS — BP 109/75 | HR 74 | Temp 97.7°F | Ht 66.0 in | Wt 198.0 lb

## 2022-07-15 DIAGNOSIS — N898 Other specified noninflammatory disorders of vagina: Secondary | ICD-10-CM | POA: Diagnosis not present

## 2022-07-15 DIAGNOSIS — H6123 Impacted cerumen, bilateral: Secondary | ICD-10-CM | POA: Diagnosis not present

## 2022-07-15 NOTE — Patient Instructions (Signed)
It sounds like you may be having a recurrent cyst in the vaginal area.  The next time 1 appears, you should take a look down there with the mirror.  You should also call for an urgent care appointment.   We have referred you to an ENT specialist to have your ears cleaned.

## 2022-07-15 NOTE — Progress Notes (Signed)
Patient ID: Monica Mcfarland, female    DOB: [birth year]  MRN: [id number]  CC: Abscess (Intermittent abscess in vaginal area X3 weeks. Neoma Laming received flu vax. )   Subjective: Monica Mcfarland is a 52 y.o. female who presents for UC visit Her concerns today include:  DM type II, HL, obesity    Patient last seen 07/01/2022.  Prior to that she was seen at urgent care a few days before and diagnosed with small right labial abscess that was treated with Bactrim for 7 days.  When I saw her, no lesions were noted on the mons pubis, the labia or the introitus.  She presents today reporting abscess in the vaginal area.  Reports it is intermittent.  Shows up for a few days then goes away.  Was there yesterday but not today. She felt a soft ball on the RT labia.  She did not take a mirror to exam the area.  It was not sore to touch and no drainage. "I feel like I am not in control of my own body."  She wonders what may be causing the soft ball lesion to appear intermittently Also report decreased hearing in the left ear over the past 2 months.  She is to hold the phone to the right ear when she is talking. Patient Active Problem List   Diagnosis Date Noted   Cocaine use disorder in remission 07/01/2022   Mild major depression ([place]) 07/01/2022   Hyperlipidemia associated with type 2 diabetes mellitus ([place]) 07/01/2020   Influenza vaccine needed 04/01/2020   Type 2 diabetes mellitus with obesity ([place]) 07/15/2019     Current Outpatient Medications on File Prior to Visit  Medication Sig Dispense Refill   Aspirin 81 MG CAPS Take 4 tablets by mouth daily. For chest pain     Blood Glucose Monitoring Suppl (TRUE METRIX METER) w/Device KIT Use to check blood sugars once per day either fasting/2 hours after a meal or before bedtime 1 kit 0   glucose blood (TRUE METRIX BLOOD GLUCOSE TEST) test strip Use as instructed to check blood sugar once per day 100 each 3   metFORMIN (GLUCOPHAGE-XR) 500 MG 24 hr  tablet Take 1 tablet (500 mg total) by mouth daily with breakfast. TAKE 2 TABLETS BY MOUTH TWICE DAILY WITH A MEAL. 90 tablet 1   rosuvastatin (CRESTOR) 10 MG tablet Take 1 tablet (10 mg total) by mouth daily. 90 tablet 1   TRUEplus Lancets 28G MISC Use once daily when checking blood sugars 100 each 3   No current facility-administered medications on file prior to visit.    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, rash, hives   Shellfish Allergy Anaphylaxis    Social History   Socioeconomic History   Marital status: Single    Spouse name: Not on file   Number of children: Not on file   Years of education: Not on file   Highest education level: Not on file  Occupational History   Not on file  Tobacco Use   Smoking status: Former    Types: Cigarettes    Start date: 07/12/2015   Smokeless tobacco: Never  Substance and Sexual Activity   Alcohol use: Not Currently   Drug use: Not Currently   Sexual activity: Not Currently  Other Topics Concern   Not on file  Social History Narrative   Not on file   Social Determinants of Health  Financial Resource Strain: Not on file  Food Insecurity: No Food Insecurity (02/02/2022)   Hunger Vital Sign    Worried About Running Out of Food in the Last Year: Never true    Ran Out of Food in the Last Year: Never true  Transportation Needs: No Transportation Needs (02/02/2022)   PRAPARE - Hydrologist (Medical): No    Lack of Transportation (Non-Medical): No  Physical Activity: Not on file  Stress: Not on file  Social Connections: Not on file  Intimate Partner Violence: Not on file    Family History  Problem Relation Age of Onset   Diabetes Mother    Heart disease Mother    Diabetes Father    Diabetes Sister    Breast cancer Paternal Aunt    Hearing loss Maternal Grandmother    Hearing loss Maternal Grandfather     Past Surgical History:  Procedure  Laterality Date   CESAREAN SECTION     3 children     ROS: Review of Systems Negative except as stated above  PHYSICAL EXAM: BP 109/75 (BP Location: Left Arm, Patient Position: Sitting, Cuff Size: Normal)   Pulse 74   Temp 97.7 F (36.5 C) (Oral)   Ht '5\' 6"'$  (1.676 m)   Wt 198 lb (89.8 kg)   SpO2 95%   BMI 31.96 kg/m   Physical Exam  General appearance - alert, well appearing, middle-aged Caucasian female and in no distress Mental status - normal mood, behavior, speech, dress, motor activity, and thought processes Ears: She has moderate amount of impacted wax in both ear canals obscuring most of the canals and tympanic membranes GU: Patient declines examination of the pelvic area stating that the small ball that she felt yesterday is no longer there today.    Latest Ref Rng & Units 12/04/2021    4:33 PM 03/02/2021   12:03 PM 07/01/2020   11:37 AM  CMP  Glucose 70 - 99 mg/dL 95  111    BUN 6 - 24 mg/dL 13  12    Creatinine 0.57 - 1.00 mg/dL 0.65  0.76    Sodium 134 - 144 mmol/L 140  142    Potassium 3.5 - 5.2 mmol/L 3.9  4.8    Chloride 96 - 106 mmol/L 102  102    CO2 20 - 29 mmol/L 25  26    Calcium 8.7 - 10.2 mg/dL 9.4  9.5    Total Protein 6.0 - 8.5 g/dL 7.7   7.5   Total Bilirubin 0.0 - 1.2 mg/dL 0.9   0.9   Alkaline Phos 44 - 121 IU/L 80   81   AST 0 - 40 IU/L 20   18   ALT 0 - 32 IU/L 11   10    Lipid Panel     Component Value Date/Time   CHOL 105 07/01/2022 0931   TRIG 49 07/01/2022 0931   HDL 65 07/01/2022 0931   CHOLHDL 1.6 07/01/2022 0931   LDLCALC 28 07/01/2022 0931    CBC    Component Value Date/Time   WBC 6.7 12/04/2021 1633   WBC 6.6 11/23/2019 1415   RBC 4.15 12/04/2021 1633   RBC 4.30 11/23/2019 1415   HGB 12.5 12/04/2021 1633   HCT 37.2 12/04/2021 1633   PLT 189 12/04/2021 1633   MCV 90 12/04/2021 1633   MCH 30.1 12/04/2021 1633   MCH 30.5 11/23/2019 1415   MCHC 33.6 12/04/2021 1633   MCHC  32.7 11/23/2019 1415   RDW 12.3 12/04/2021 1633     ASSESSMENT AND PLAN:  1. Impacted cerumen of both ears - Ambulatory referral to ENT  2. Vaginal lesion Patient reports intermittent soft movable ball in the vaginal area on the right labia.  She declines examination today as she states it is no longer there.  Given that it is not tender/sore when it occurs, it may be a cyst.  Advised to take a look at it using a mirror the next time it occurs.  She can also call for an urgent care appointment.    Patient was given the opportunity to ask questions.  Patient verbalized understanding of the plan and was able to repeat key elements of the plan.   This documentation was completed using Radio producer.  Any transcriptional errors are unintentional.  No orders of the defined types were placed in this encounter.    Requested Prescriptions    No prescriptions requested or ordered in this encounter    No follow-ups on file.  Karle Plumber, MD, FACP

## 2022-08-13 ENCOUNTER — Ambulatory Visit (INDEPENDENT_AMBULATORY_CARE_PROVIDER_SITE_OTHER): Payer: Medicaid Other

## 2022-08-13 ENCOUNTER — Ambulatory Visit (HOSPITAL_COMMUNITY)
Admission: EM | Admit: 2022-08-13 | Discharge: 2022-08-13 | Disposition: A | Discharge status: Home / Self Care | Payer: Medicaid Other | Attending: Internal Medicine | Admitting: Internal Medicine

## 2022-08-13 ENCOUNTER — Ambulatory Visit: Payer: Self-pay

## 2022-08-13 ENCOUNTER — Telehealth: Payer: Self-pay | Admitting: Internal Medicine

## 2022-08-13 ENCOUNTER — Encounter (HOSPITAL_COMMUNITY): Payer: Self-pay

## 2022-08-13 DIAGNOSIS — M546 Pain in thoracic spine: Secondary | ICD-10-CM

## 2022-08-13 DIAGNOSIS — L720 Epidermal cyst: Secondary | ICD-10-CM | POA: Diagnosis not present

## 2022-08-13 DIAGNOSIS — M47814 Spondylosis without myelopathy or radiculopathy, thoracic region: Secondary | ICD-10-CM | POA: Diagnosis not present

## 2022-08-13 HISTORY — DX: Type 2 diabetes mellitus without complications: E11.9

## 2022-08-13 MED ORDER — AMOXICILLIN-POT CLAVULANATE 875-125 MG PO TABS: 1.0000 | ORAL_TABLET | Freq: Two times a day (BID) | ORAL | 0 refills | Status: DC

## 2022-08-13 NOTE — Telephone Encounter (Signed)
Noted  

## 2022-08-13 NOTE — Discharge Instructions (Signed)
X-ray of your back was negative for acute abnormality/mass. This is very reassuring.  Unsure of what is causing your cyst to get bigger and more painful. I would like to trial antibiotics twice daily for the next 7 days. Take with food to avoid stomach upset. Schedule an appointment with your primary care provider for likely ultrasound to determine what is causing pain and worsening swelling of the cyst to your back.  If you develop any new or worsening symptoms or do not improve in the next 2 to 3 days, please return.  If your symptoms are severe, please go to the emergency room.  Follow-up with your primary care provider for further evaluation and management of your symptoms as well as ongoing wellness visits.  I hope you feel better!

## 2022-08-13 NOTE — Telephone Encounter (Signed)
Pt stated she was sent to urgent care by the nurse this morning for the cyst on her back and was advised to call her PCP and let her know she needs an ultrasound to determine the cause of the pain and worsening swelling of the cyst.  Please advise.

## 2022-08-13 NOTE — Telephone Encounter (Signed)
  Chief Complaint: cyst on back - now enlarged and painful Symptoms: pain - cyst larger - yesterday it was warm to the touch Frequency: 6 months - but recently changed Pertinent Negatives: Patient denies fever Disposition: [] ED /[x] Urgent Care (no appt availability in office) / [] Appointment(In office/virtual)/ []  [facility]/ [] Home Care/ [] Refused Recommended Disposition /[]  Mobile Bus/ []  Follow-up with PCP Additional Notes: Pt states that she has had a  cyst on her back for about 6 months. Cyst has gotten much bigger and is painful. Yesterday it was warm to the touch.    Reason for Disposition  [1] Swelling is painful to touch AND [2] no fever  Answer Assessment - Initial Assessment Questions 1. APPEARANCE of SWELLING: "What does it look like?"     cyst 2. SIZE: "How large is the swelling?" (e.g., inches, cm; or compare to size of pinhead, tip of pen, eraser, coin, pea, grape, ping pong ball)      Much bigger 3. LOCATION: "Where is the swelling located?"     back 4. ONSET: "When did the swelling start?"     6 months 5. COLOR: "What color is it?" "Is there more than one color?"     unknown 6. PAIN: "Is there any pain?" If Yes, ask: "How bad is the pain?" (e.g., scale 1-10; or mild, moderate, severe)     - NONE (0): no pain   - MILD (1-3): doesn't interfere with normal activities    - MODERATE (4-7): interferes with normal activities or awakens from sleep    - SEVERE (8-10): excruciating pain, unable to do any normal activities     8/10 7. ITCH: "Does it itch?" If Yes, ask: "How bad is the itch?"      no 8. CAUSE: "What do you think caused the swelling?" 9 OTHER SYMPTOMS: "Do you have any other symptoms?" (e.g., fever)     Hot to touch  Protocols used: Skin Lump or Localized Swelling-A-AH

## 2022-08-13 NOTE — ED Triage Notes (Signed)
Repeat cyst on the lower back. Onset August, but is larger and painful now.

## 2022-08-13 NOTE — Telephone Encounter (Signed)
Routing to PCP for review.

## 2022-08-13 NOTE — ED Provider Notes (Signed)
[facility]    CSN: [id number] Arrival date & time: 08/13/22  1036      History   Chief Complaint Chief Complaint  Patient presents with   Abscess    HPI Monica Mcfarland is a 52 y.o. female.   Patient presents to urgent care for evaluation of cyst to the thoracic spine that has been present for greater than 6 months. Cyst has remained the same size for the past 6 months since she noticed it. She states the cyst used to be soft and non-tender, but now it is firm and causing pain as of 1-2 days ago. She has mentioned this to her PCP in the past and was advised watchful waiting to see if it changes. Denies recent injuries/trauma to the T back or abdomen. No erythema, surroudning area of soft tissue swelling, fever/chills, or numbess/tingling to the lower extremities. No radicular pain from the T spine to the bilateral lower extremities, urinary symptoms, saddle anesthesia symptoms, unilateral weakness, paresthesias, changes in stooling habits, or dizziness. She has not used any medications to try to help with symptoms.    Abscess   Past Medical History:  Diagnosis Date   Diabetes mellitus without complication     Patient Active Problem List   Diagnosis Date Noted   Cocaine use disorder in remission 07/01/2022   Mild major depression 07/01/2022   Hyperlipidemia associated with type 2 diabetes mellitus 07/01/2020   Influenza vaccine needed 04/01/2020   Type 2 diabetes mellitus with obesity 07/15/2019    Past Surgical History:  Procedure Laterality Date   CESAREAN SECTION     3 children    HIP SURGERY Bilateral    NECK SURGERY      OB History   No obstetric history on file.      Home Medications    Prior to Admission medications   Medication Sig Start Date End Date Taking? Authorizing Provider  amoxicillin-clavulanate (AUGMENTIN) 875-125 MG tablet Take 1 tablet by mouth every 12 (twelve) hours. 08/13/22  Yes Carlisle Beers, FNP  metFORMIN  (GLUCOPHAGE-XR) 500 MG 24 hr tablet Take 1 tablet (500 mg total) by mouth daily with breakfast. TAKE 2 TABLETS BY MOUTH TWICE DAILY WITH A MEAL. 07/01/22  Yes Marcine Matar, MD  rosuvastatin (CRESTOR) 10 MG tablet Take 1 tablet (10 mg total) by mouth daily. 07/01/22  Yes Marcine Matar, MD  Aspirin 81 MG CAPS Take 4 tablets by mouth daily. For chest pain    [provider]  Blood Glucose Monitoring Suppl (TRUE METRIX METER) w/Device KIT Use to check blood sugars once per day either fasting/2 hours after a meal or before bedtime 07/12/19   Fulp, Cammie, MD  glucose blood (TRUE METRIX BLOOD GLUCOSE TEST) test strip Use as instructed to check blood sugar once per day 07/12/19   Cain Saupe, MD  TRUEplus Lancets 28G MISC Use once daily when checking blood sugars 07/12/19   Cain Saupe, MD    Family History Family History  Problem Relation Age of Onset   Diabetes Mother    Heart disease Mother    Diabetes Father    Diabetes Sister    Breast cancer Paternal Aunt    Hearing loss Maternal Grandmother    Hearing loss Maternal Grandfather     Social History Social History   Tobacco Use   Smoking status: Former    Types: Cigarettes    Start date: 07/12/2015   Smokeless tobacco: Never  Vaping Use  Vaping Use: Never used  Substance Use Topics   Alcohol use: Not Currently   Drug use: Not Currently     Allergies   Other and Shellfish allergy   Review of Systems Review of Systems Per HPI  Physical Exam Triage Vital Signs ED Triage Vitals  Enc Vitals Group     BP 08/13/22 1119 101/69     Pulse Rate 08/13/22 1119 75     Resp 08/13/22 1119 18     Temp 08/13/22 1119 97.6 F (36.4 C)     Temp Source 08/13/22 1119 Oral     SpO2 08/13/22 1119 92 %     Weight 08/13/22 1119 202 lb (91.6 kg)     Height 08/13/22 1119 5\' 6"  (1.676 m)     Head Circumference --      Peak Flow --      Pain Score 08/13/22 1117 8     Pain Loc --      Pain Edu? --      Excl. in GC? --     No data found.  Updated Vital Signs BP 101/69 (BP Location: Right Arm)   Pulse 75   Temp 97.6 F (36.4 C) (Oral)   Resp 18   Ht 5\' 6"  (1.676 m)   Wt 202 lb (91.6 kg)   SpO2 92%   BMI 32.60 kg/m   Visual Acuity Right Eye Distance:   Left Eye Distance:   Bilateral Distance:    Right Eye Near:   Left Eye Near:    Bilateral Near:     Physical Exam Vitals and nursing note reviewed.  Constitutional:      Appearance: She is not ill-appearing or toxic-appearing.  HENT:     Head: Normocephalic and atraumatic.     Right Ear: Hearing and external ear normal.     Left Ear: Hearing and external ear normal.     Nose: Nose normal.     Mouth/Throat:     Lips: Pink.  Eyes:     General: Lids are normal. Vision grossly intact. Gaze aligned appropriately.     Extraocular Movements: Extraocular movements intact.     Conjunctiva/sclera: Conjunctivae normal.  Cardiovascular:     Rate and Rhythm: Normal rate and regular rhythm.     Heart sounds: Normal heart sounds, S1 normal and S2 normal.  Pulmonary:     Effort: Pulmonary effort is normal. No respiratory distress.     Breath sounds: Normal breath sounds and air entry.  Musculoskeletal:     Cervical back: Normal and neck supple.     Thoracic back: Bony tenderness (secondary to cyst) present. No swelling, edema, deformity, signs of trauma, lacerations or spasms. Normal range of motion. No scoliosis.       Back:     Comments: 0.5-1cm in diameter epidermoid cyst present to the T spine. Non-fluctuant, firm. No erythema, warmth, or surrounding soft tissue swelling. Very tender to palpation.   Skin:    General: Skin is warm and dry.     Capillary Refill: Capillary refill takes less than 2 seconds.     Findings: No rash.  Neurological:     General: No focal deficit present.     Mental Status: She is alert and oriented to person, place, and time. Mental status is at baseline.     Cranial Nerves: No dysarthria or facial asymmetry.   Psychiatric:        Mood and Affect: Mood normal.  Speech: Speech normal.        Behavior: Behavior normal.        Thought Content: Thought content normal.        Judgment: Judgment normal.      UC Treatments / Results  Labs (all labs ordered are listed, but only abnormal results are displayed) Labs Reviewed - No data to display  EKG   Radiology DG Thoracic Spine 2 View  Result Date: 08/13/2022 CLINICAL DATA:  Thoracic back pain. EXAM: THORACIC SPINE 2 VIEWS COMPARISON:  Chest x-ray dated November 22, 2019. FINDINGS: Twelve rib-bearing thoracic vertebral bodies. No acute fracture or subluxation. Vertebral body heights are preserved. Alignment is normal. Mild diffuse anterior endplate spurring. Intervertebral disc spaces are relatively maintained. IMPRESSION: 1. Mild thoracic spondylosis. Electronically Signed   By: Obie Dredge M.D.   On: 08/13/2022 12:26    Procedures Procedures (including critical care time)  Medications Ordered in UC Medications - No data to display  Initial Impression / Assessment and Plan / UC Course  I have reviewed the triage vital signs and the nursing notes.  Pertinent labs & imaging results that were available during my care of the patient were reviewed by me and considered in my medical decision making (see chart for details).   1. Epidermoid cyst of skin of back Unclear etiology of cyst. X-ray performed to rule out underlying bony abnormality. X-ray of the T spine is without acute changes but does show mild thoracic spondylosis. Ultimately, recommend ultrasound to further evaluate lesion in the outpatient setting. Encouraged patient to call PCP to schedule an appointment for further workup. No red flag signs or symptoms indicating need for emergent referral to ER for imaging/further workup. Due to tenderness and changes to cyst, will treat this with trial of Augmentin to see if this improves symptoms in the meantime prior to follow-up with her  PCP for ongoing workup. She is agreeable with plan. Warm compresses as needed to the cyst as well.   Discussed physical exam and available lab work findings in clinic with patient.  Counseled patient regarding appropriate use of medications and potential side effects for all medications recommended or prescribed today. Discussed red flag signs and symptoms of worsening condition,when to call the PCP office, return to urgent care, and when to seek higher level of care in the emergency department. Patient verbalizes understanding and agreement with plan. All questions answered. Patient discharged in stable condition.   Final Clinical Impressions(s) / UC Diagnoses   Final diagnoses:  Epidermoid cyst of skin of back     Discharge Instructions      X-ray of your back was negative for acute abnormality/mass. This is very reassuring.  Unsure of what is causing your cyst to get bigger and more painful. I would like to trial antibiotics twice daily for the next 7 days. Take with food to avoid stomach upset. Schedule an appointment with your primary care provider for likely ultrasound to determine what is causing pain and worsening swelling of the cyst to your back.  If you develop any new or worsening symptoms or do not improve in the next 2 to 3 days, please return.  If your symptoms are severe, please go to the emergency room.  Follow-up with your primary care provider for further evaluation and management of your symptoms as well as ongoing wellness visits.  I hope you feel better!   ED Prescriptions     Medication Sig Dispense Auth. Provider   amoxicillin-clavulanate (AUGMENTIN) 875-125  MG tablet Take 1 tablet by mouth every 12 (twelve) hours. 14 tablet Carlisle Beers, FNP      PDMP not reviewed this encounter.   Carlisle Beers, FNP 08/15/22 0900

## 2022-08-16 NOTE — Telephone Encounter (Signed)
Searched for available appointments at [facility] but no availability. Called & spoke to the patient. Informed of no availability at [facility]. Advised the patient to go to the mobile unit. Patient stated that she would like to go on 08/17/2022. Address & hours were provided to the patient. Monica Mcfarland expressed verbal understanding. No further questions at this time.

## 2022-08-17 ENCOUNTER — Encounter: Payer: Self-pay | Admitting: Physician Assistant

## 2022-08-17 ENCOUNTER — Ambulatory Visit: Payer: Medicaid Other | Admitting: Physician Assistant

## 2022-08-17 VITALS — BP 102/74 | HR 68 | Resp 18 | Ht 66.0 in | Wt 200.6 lb

## 2022-08-17 DIAGNOSIS — L720 Epidermal cyst: Secondary | ICD-10-CM

## 2022-08-17 DIAGNOSIS — E1169 Type 2 diabetes mellitus with other specified complication: Secondary | ICD-10-CM

## 2022-08-17 DIAGNOSIS — Z09 Encounter for follow-up examination after completed treatment for conditions other than malignant neoplasm: Secondary | ICD-10-CM

## 2022-08-17 DIAGNOSIS — E669 Obesity, unspecified: Secondary | ICD-10-CM | POA: Diagnosis not present

## 2022-08-17 DIAGNOSIS — Z6832 Body mass index (BMI) 32.0-32.9, adult: Secondary | ICD-10-CM | POA: Diagnosis not present

## 2022-08-17 MED ORDER — DOXYCYCLINE HYCLATE 100 MG PO TABS: 100.0000 mg | ORAL_TABLET | Freq: Two times a day (BID) | ORAL | 0 refills | Status: AC

## 2022-08-17 MED ORDER — FLUCONAZOLE 150 MG PO TABS
150.0000 mg | ORAL_TABLET | Freq: Once | ORAL | 0 refills | Status: AC
Start: 2022-08-17 — End: 2022-08-17

## 2022-08-17 MED ORDER — MUPIROCIN 2 % EX OINT
1.0000 | TOPICAL_OINTMENT | Freq: Two times a day (BID) | CUTANEOUS | 0 refills | Status: AC
Start: 2022-08-17 — End: ?

## 2022-08-17 NOTE — Progress Notes (Signed)
Patient ID: [name], female   DOB: [birth year], 52 y.o.   MRN: [id number]    Monica Mcfarland, is a 52 y.o. female  [id number]  [id number]  DOB - Dec 27, 1970  Chief Complaint  Patient presents with   Back Pain    Feels like its on fire. Shooting and cramping pain. 8/10 pain       Subjective:   Monica Mcfarland is a 52 y.o. female here today for a follow up visit UC visit 08/13/2022 for infected sebaceous cyst and started on augmentin.  She says the area has not improved but has become more tender and warm to the touch.   No fever or s/sx of systemic illness.  She would like to have a referral to have it removed.    From UC a/p 1. Epidermoid cyst of skin of back Unclear etiology of cyst. X-ray performed to rule out underlying bony abnormality. X-ray of the T spine is without acute changes but does show mild thoracic spondylosis. Ultimately, recommend ultrasound to further evaluate lesion in the outpatient setting. Encouraged patient to call PCP to schedule an appointment for further workup. No red flag signs or symptoms indicating need for emergent referral to ER for imaging/further workup. Due to tenderness and changes to cyst, will treat this with trial of Augmentin to see if this improves symptoms in the meantime prior to follow-up with her PCP for ongoing workup. She is agreeable with plan. Warm compresses as needed to the cyst as well.    No problems updated.  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 Authorizing Provider  Aspirin 81 MG CAPS Take 4 tablets by mouth daily. For chest pain   Yes [provider]  Blood Glucose Monitoring Suppl  (TRUE METRIX METER) w/Device KIT Use to check blood sugars once per day either fasting/2 hours after a meal or before bedtime 07/12/19  Yes Fulp, Cammie, MD  doxycycline (VIBRA-TABS) 100 MG tablet Take 1 tablet (100 mg total) by mouth 2 (two) times daily. 08/17/22  Yes Georgian Co M, PA-C  fluconazole (DIFLUCAN) 150 MG tablet Take 1 tablet (150 mg total) by mouth once for 1 dose. If needed for yeast infection 08/17/22 08/17/22 Yes Khristin Keleher, Marzella Schlein, PA-C  glucose blood (TRUE METRIX BLOOD GLUCOSE TEST) test strip Use as instructed to check blood sugar once per day 07/12/19  Yes Fulp, Cammie, MD  metFORMIN (GLUCOPHAGE-XR) 500 MG 24 hr tablet Take 1 tablet (500 mg total) by mouth daily with breakfast. TAKE 2 TABLETS BY MOUTH TWICE DAILY WITH A MEAL. 07/01/22  Yes Marcine Matar, MD  mupirocin ointment (BACTROBAN) 2 % Apply 1 Application topically 2 (two) times daily. X 7-10 days 08/17/22  Yes Sohail Capraro M, PA-C  rosuvastatin (CRESTOR) 10 MG tablet Take 1 tablet (10 mg total) by mouth daily. 07/01/22  Yes Marcine Matar, MD  TRUEplus Lancets 28G MISC Use once daily when checking blood sugars 07/12/19  Yes Fulp, Cammie, MD    ROS: Neg HEENT Neg resp Neg cardiac Neg GI Neg GU Neg MS Neg psych Neg neuro  Objective:   Vitals:   08/17/22 0923  BP: 102/74  Pulse: 68  Resp: 18  SpO2: 95%  Weight: 200 lb 9.6 oz (91 kg)  Height:  (1.676 m)   Exam General appearance : Awake, alert, not in any distress. Speech Clear. Not toxic looking HEENT: Atraumatic and Normocephalic Neck: Supple, no JVD. No cervical lymphadenopathy.  Chest: Good air entry bilaterally, CTAB.  No rales/rhonchi/wheezing CVS: S1 S2 regular, no murmurs.  Back at thoracolumbar area just to the R of the spine there is a arm and soft 2cm sebaceous cyst with central puncta that is red and erythematous without induration Extremities: B/L Lower Ext shows no edema, both legs are warm to touch Neurology: Awake alert, and  oriented X 3, CN II-XII intact, Non focal Skin: No Rash  Data Review Lab Results  Component Value Date   HGBA1C 6.0 07/01/2022   HGBA1C 5.9 12/04/2021   HGBA1C 6.0 09/01/2021    Assessment & Plan   1. EIC (epidermal inclusion cyst) Stop augmentin - doxycycline (VIBRA-TABS) 100 MG tablet; Take 1 tablet (100 mg total) by mouth 2 (two) times daily.  Dispense: 20 tablet; Refill: 0 - mupirocin ointment (BACTROBAN) 2 %; Apply 1 Application topically 2 (two) times daily. X 7-10 days  Dispense: 22 g; Refill: 0 - fluconazole (DIFLUCAN) 150 MG tablet; Take 1 tablet (150 mg total) by mouth once for 1 dose. If needed for yeast infection  Dispense: 1 tablet; Refill: 0  2. Encounter for examination following treatment at hospital - Ambulatory referral to Dermatology  3. Type 2 diabetes mellitus with obesity Controlled-A1C 6.0 about 1 month ago.  Blood sugar =105 fasting this morning at home    Return if symptoms worsen or fail to improve.  The patient was given clear instructions to go to ER or return to medical center if symptoms don't improve, worsen or new problems develop. The patient verbalized understanding. The patient was told to call to get lab results if they haven't heard anything in the next week.      Georgian Co, PA-C [facility] [place], [place] [phone]   08/17/2022, 9:41 AM

## 2022-08-17 NOTE — Progress Notes (Signed)
Pt has only had a little bit of coffee with cream. Back pain since August.

## 2022-08-17 NOTE — Patient Instructions (Signed)
Stop augmentin  Epidermoid Cyst  An epidermoid cyst, also called an epidermal cyst, is a small lump under your skin. The cyst contains a substance called keratin. Do not try to pop or open the cyst yourself. What are the causes? A blocked hair follicle. A hair that curls and re-enters the skin instead of growing straight out of the skin. A blocked pore. Irritated skin. An injury to the skin. Certain conditions that are passed along from parent to child. Human papillomavirus (HPV). This happens rarely when cysts occur on the bottom of the feet. Long-term sun damage to the skin. What increases the risk? Having acne. Being female. Having an injury to the skin. Being past puberty. Having certain conditions caused by genes (genetic disorder) What are the signs or symptoms? These cysts are usually harmless, but they can get infected. Symptoms of infection may include: Redness. Inflammation. Tenderness. Warmth. Fever. A bad-smelling substance that drains from the cyst. Pus that drains from the cyst. How is this treated? In many cases, epidermoid cysts go away on their own without treatment. If a cyst becomes infected, treatment may include: Opening and draining the cyst, done by a doctor. After draining, you may need minor surgery to remove the rest of the cyst. Antibiotic medicine. Shots of medicines (steroids) that help to reduce inflammation. Surgery to remove the cyst. Surgery may be done if the cyst: Becomes large. Bothers you. Has a chance of turning into cancer. Do not try to open a cyst yourself. Follow these instructions at home: Medicines Take over-the-counter and prescription medicines as told by your doctor. If you were prescribed an antibiotic medicine, take it as told by your doctor. Do not stop taking it even if you start to feel better. General instructions Keep the area around your cyst clean and dry. Wear loose, dry clothing. Avoid touching your cyst. Check  your cyst every day for signs of infection. Check for: Redness, swelling, or pain. Fluid or blood. Warmth. Pus or a bad smell. Keep all follow-up visits. How is this prevented? Wear clean, dry, clothing. Avoid wearing tight clothing. Keep your skin clean and dry. Take showers or baths every day. Contact a doctor if: Your cyst has symptoms of infection. Your condition does not improve or gets worse. You have a cyst that looks different from other cysts you have had. You have a fever. Get help right away if: Redness spreads from the cyst into the area close by. Summary An epidermoid cyst is a small lump under your skin. If a cyst becomes infected, treatment may include surgery to open and drain the cyst, or to remove it. Take over-the-counter and prescription medicines only as told by your doctor. Contact a doctor if your condition is not improving or is getting worse. Keep all follow-up visits. This information is not intended to replace advice given to you by your health care provider. Make sure you discuss any questions you have with your health care provider. Document Revised: 07/25/2019 Document Reviewed: 07/25/2019 Elsevier Patient Education  2023 [employer].

## 2022-08-23 ENCOUNTER — Ambulatory Visit: Payer: Self-pay

## 2022-08-23 ENCOUNTER — Telehealth: Payer: Self-pay | Admitting: Internal Medicine

## 2022-08-23 NOTE — Telephone Encounter (Signed)
  Chief Complaint: cyst Symptoms: cyst on lower mid back, area has popped open and pressure and pain relieved Frequency: ongoing since August Pertinent Negatives: Patient denies drainage to area currently Disposition: ED /[] Urgent Care (no appt availability in office) / Appointment(In office/virtual)/  [facility]/ Home Care/ Refused Recommended Disposition /[] [facility]/  Follow-up with PCP Additional Notes: pt has had ongoing cyst since August 2023. Went to [facility] on 08/17/22 and seen [place], [place]. Pt was prescribed abx and bactroban oint. Pt states cyst feels a lot better and wanted to know if she needed to come in or not. Advised pt since she is currently on abx to just monitor area for signs of infection and if any sx get worse to call back so she can be seen again. Advised if area started draining again to keep covered to prevent infection. Pt verbalized understanding. Pt states she also got called for dermatology referral but d/t she [place] they didn't have any appts until June or July.   Summary: cysts on back   Pt states that she had a cysts on her back and when she woke up this morning her shirt was bloody from where the cysts popped on its on and she states it feel much better. Pt is wanting to know if there is anything she needs to do. Please advise.         Reason for Disposition  Prevention of boils, questions about  Answer Assessment - Initial Assessment Questions 1. APPEARANCE of BOIL: "What does the boil look like?"      Soft area that got infected and got hard  2. LOCATION: "Where is the boil located?"      In middle of lower back  3. NUMBER: "How many boils are there?"      1 area  4. SIZE: "How big is the boil?" (e.g., inches, cm; compare to size of a coin or other object)     Pretty big area  5. ONSET: "When did the boil start?"     Ongoing issue since August  6. PAIN: "Is there any pain?" If Yes, ask: "How bad is the  pain?"   (Scale 1-10; or mild, moderate, severe)     No  9. OTHER SYMPTOMS: "Do you have any other symptoms?" (e.g., shaking chills, weakness, rash elsewhere on body)     Pressure relieved d/t popped open  Protocols used: Boil (Skin Abscess)-A-AH

## 2022-11-02 ENCOUNTER — Ambulatory Visit: Payer: Medicaid Other | Admitting: Internal Medicine

## 2023-08-30 IMAGING — CR DG ANKLE COMPLETE 3+V*L*
3 series · 3 of 3 positions shown · non-contrast
Comparison: None.

CLINICAL DATA: Fall.

EXAM:
LEFT FEMUR 2 VIEWS; LEFT FOOT - COMPLETE 3+ VIEW; LEFT ANKLE
COMPLETE - 3+ VIEW

[ankle ap]
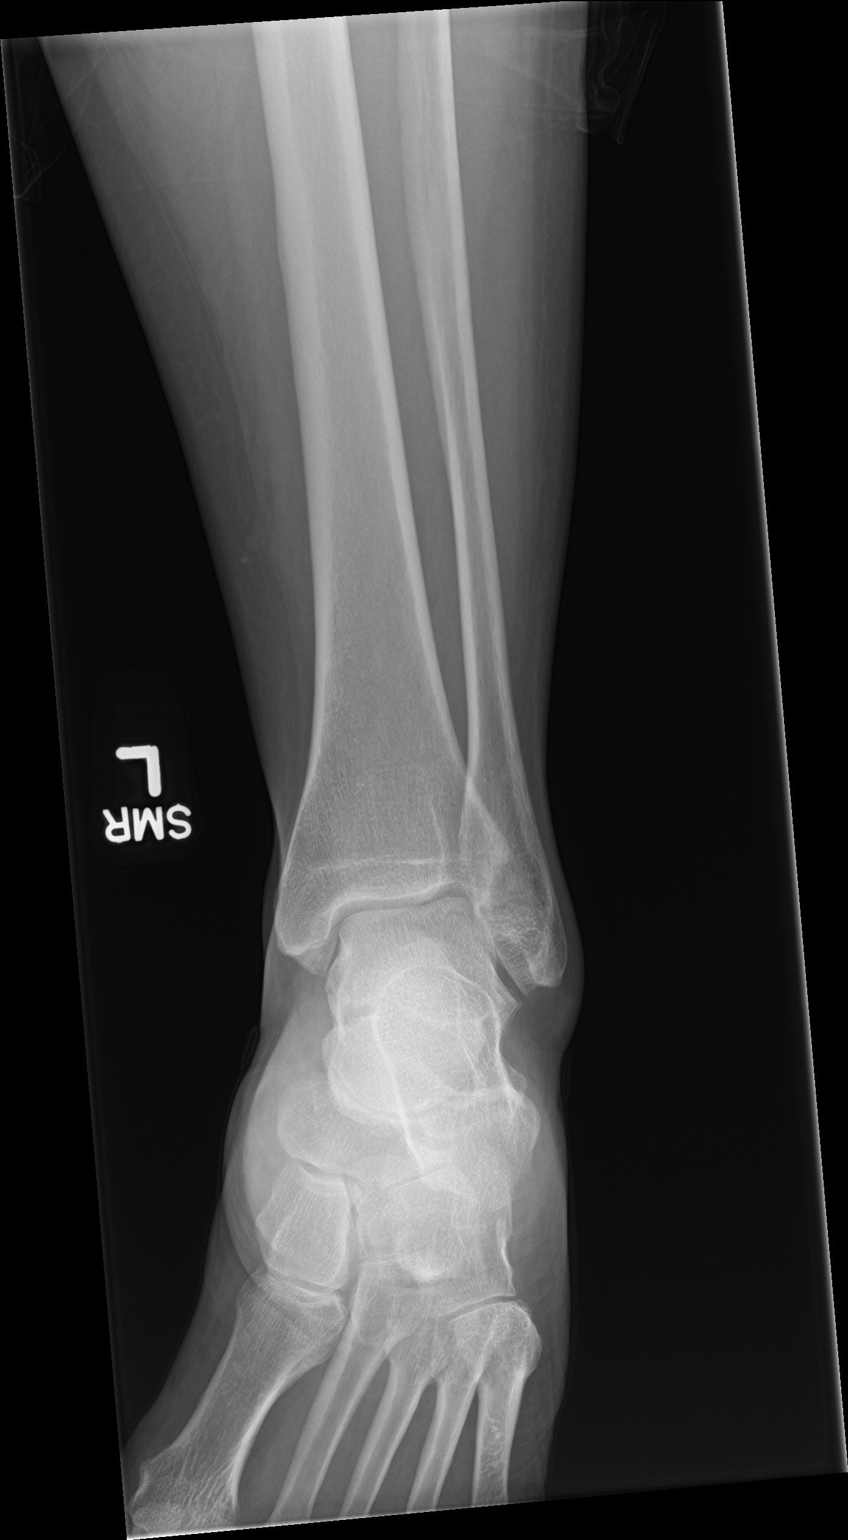

[ankle obl]
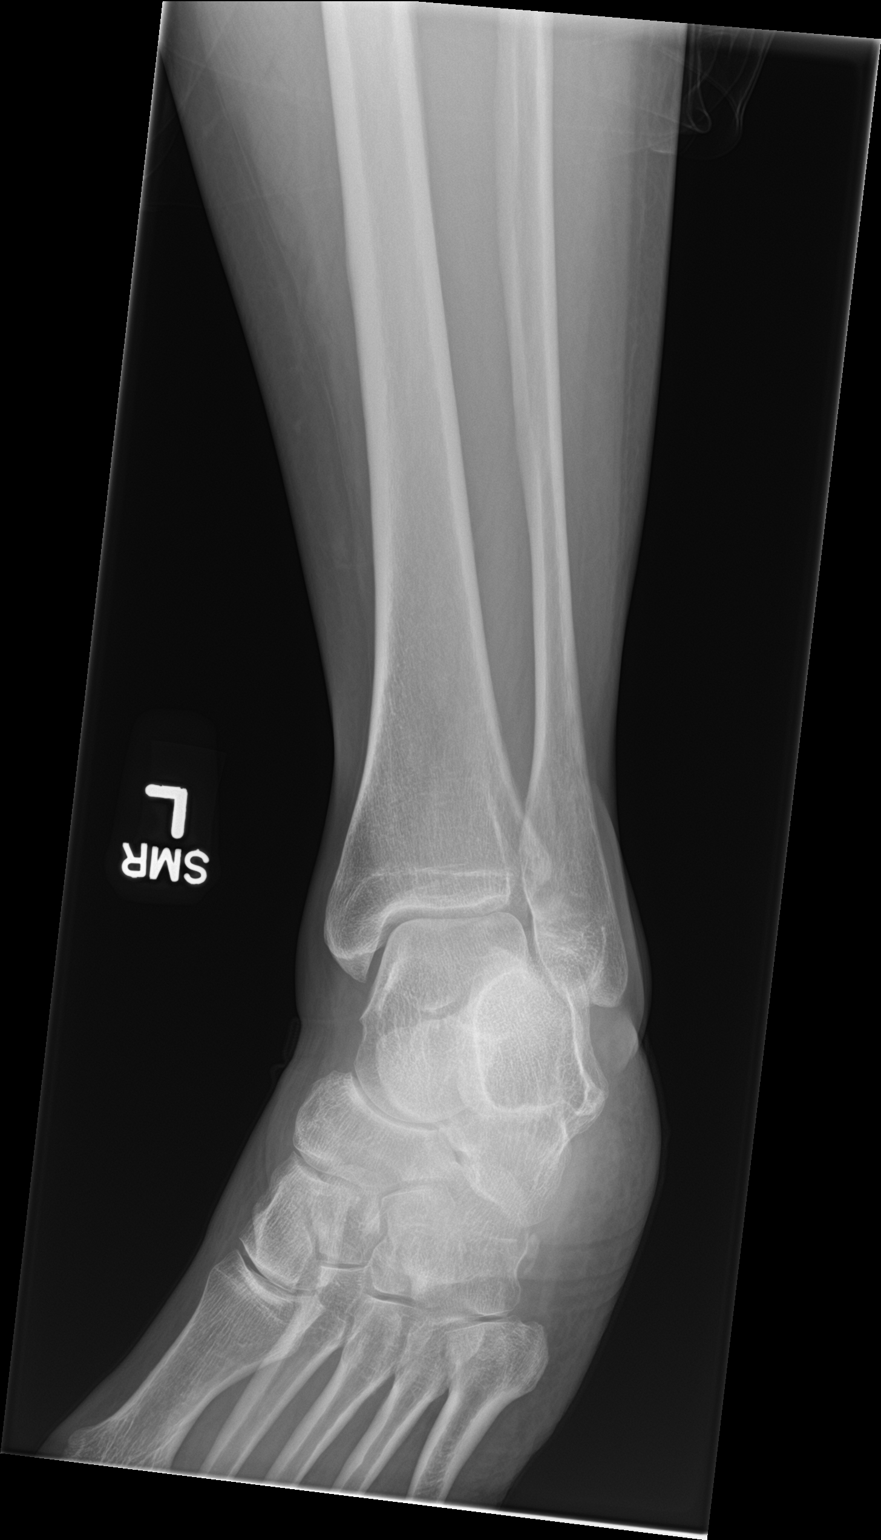

[ankle lat]
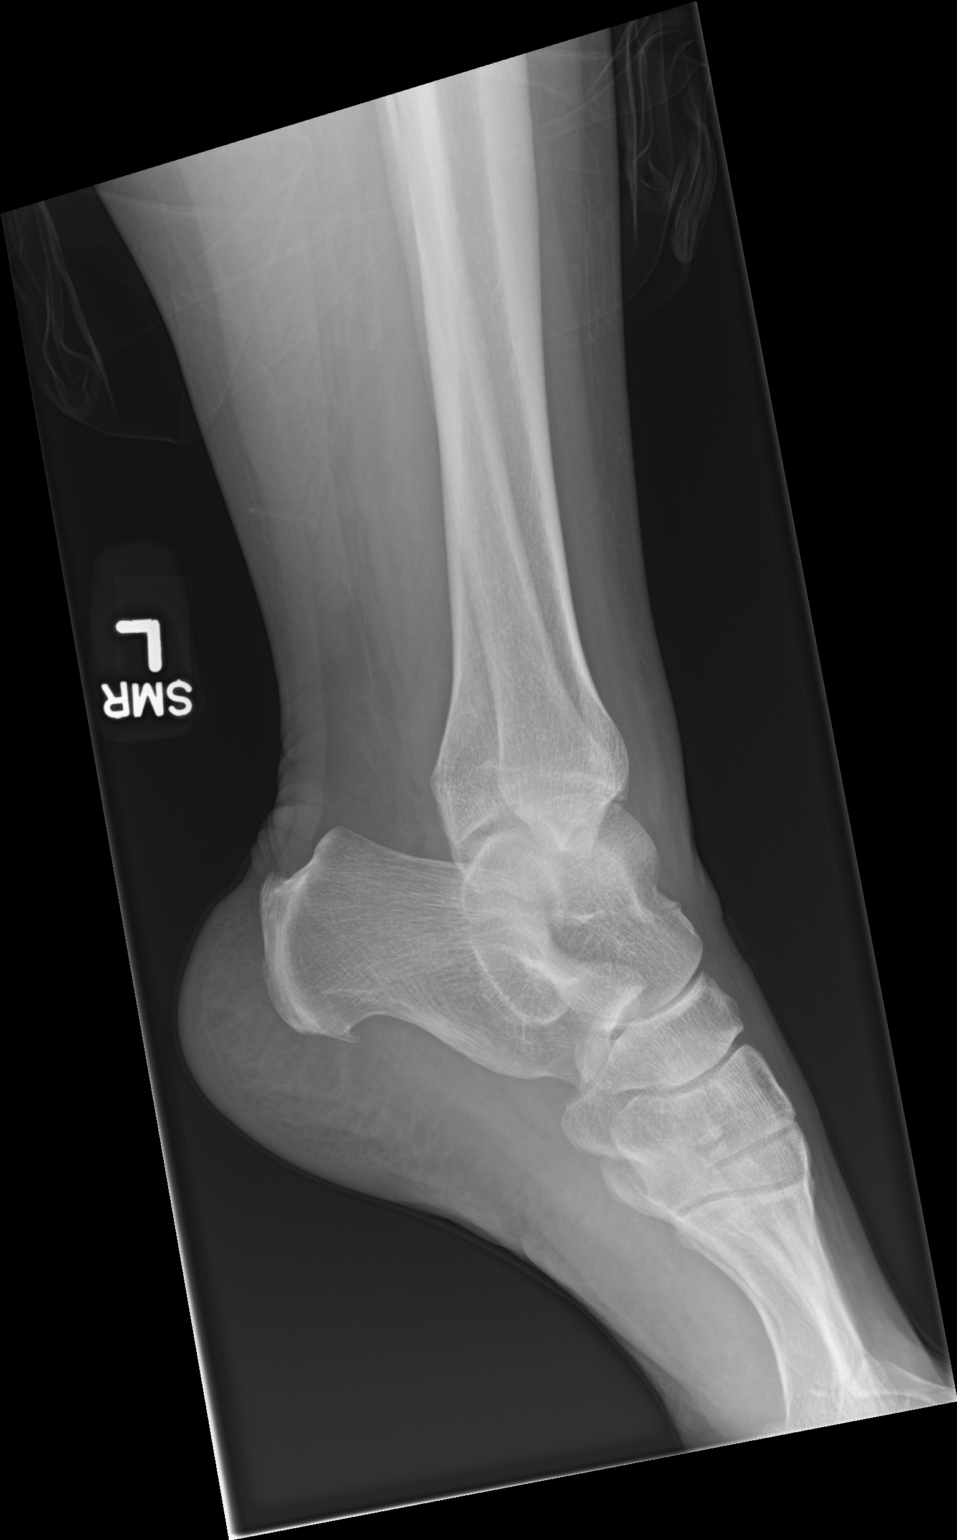

[3 of 3 positions shown; findings below may reference images not displayed]

FINDINGS: There is no evidence of fracture or other focal bone lesions. Soft
tissues are unremarkable.
IMPRESSION: Negative.

## 2023-08-30 IMAGING — CR DG FOOT COMPLETE 3+V*L*
3 series · 3 of 3 positions shown · non-contrast
Comparison: None.

CLINICAL DATA: Fall.

EXAM:
LEFT FEMUR 2 VIEWS; LEFT FOOT - COMPLETE 3+ VIEW; LEFT ANKLE
COMPLETE - 3+ VIEW

[foot lat]
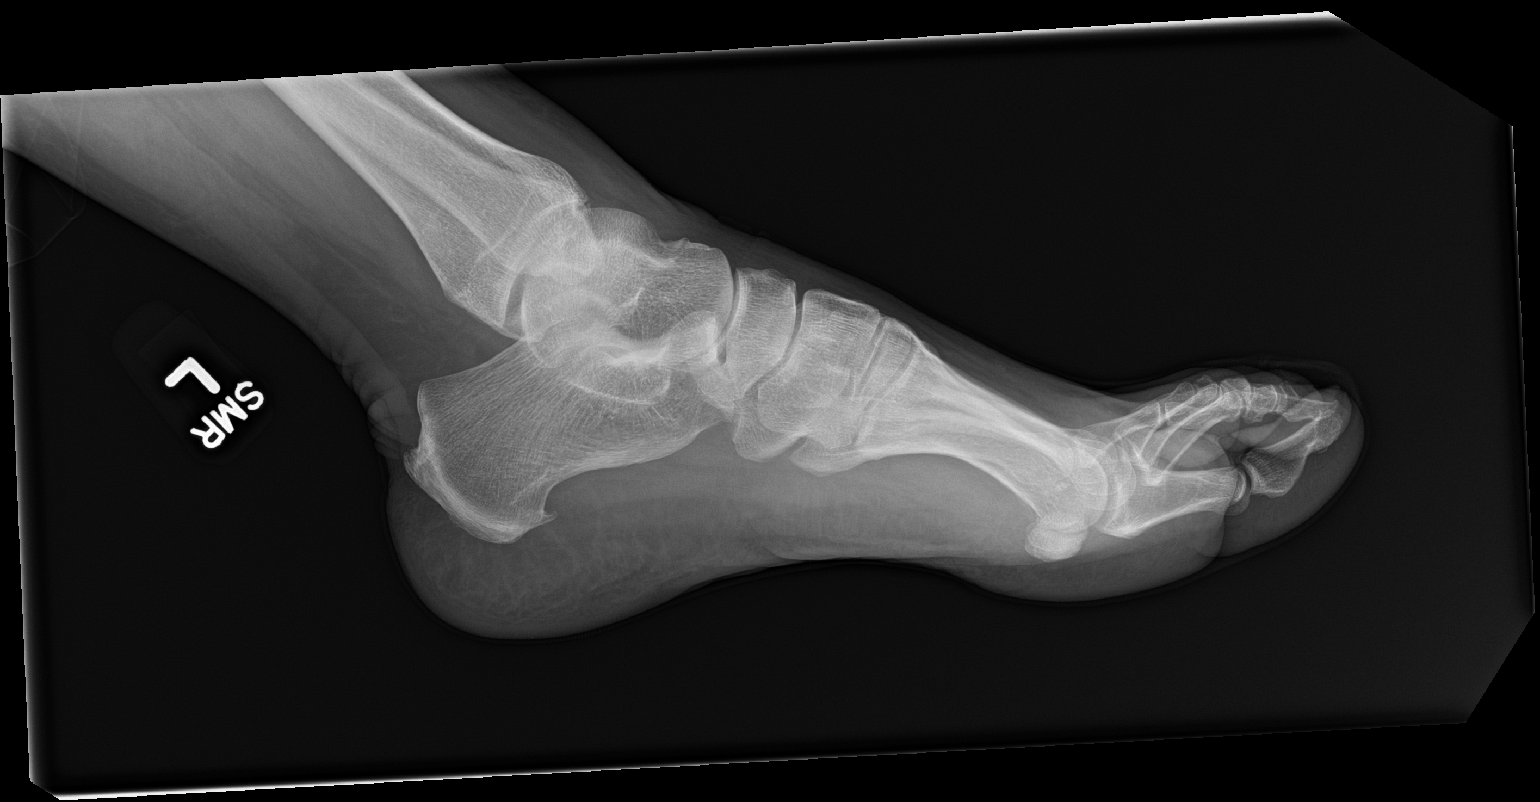

[foot obl]
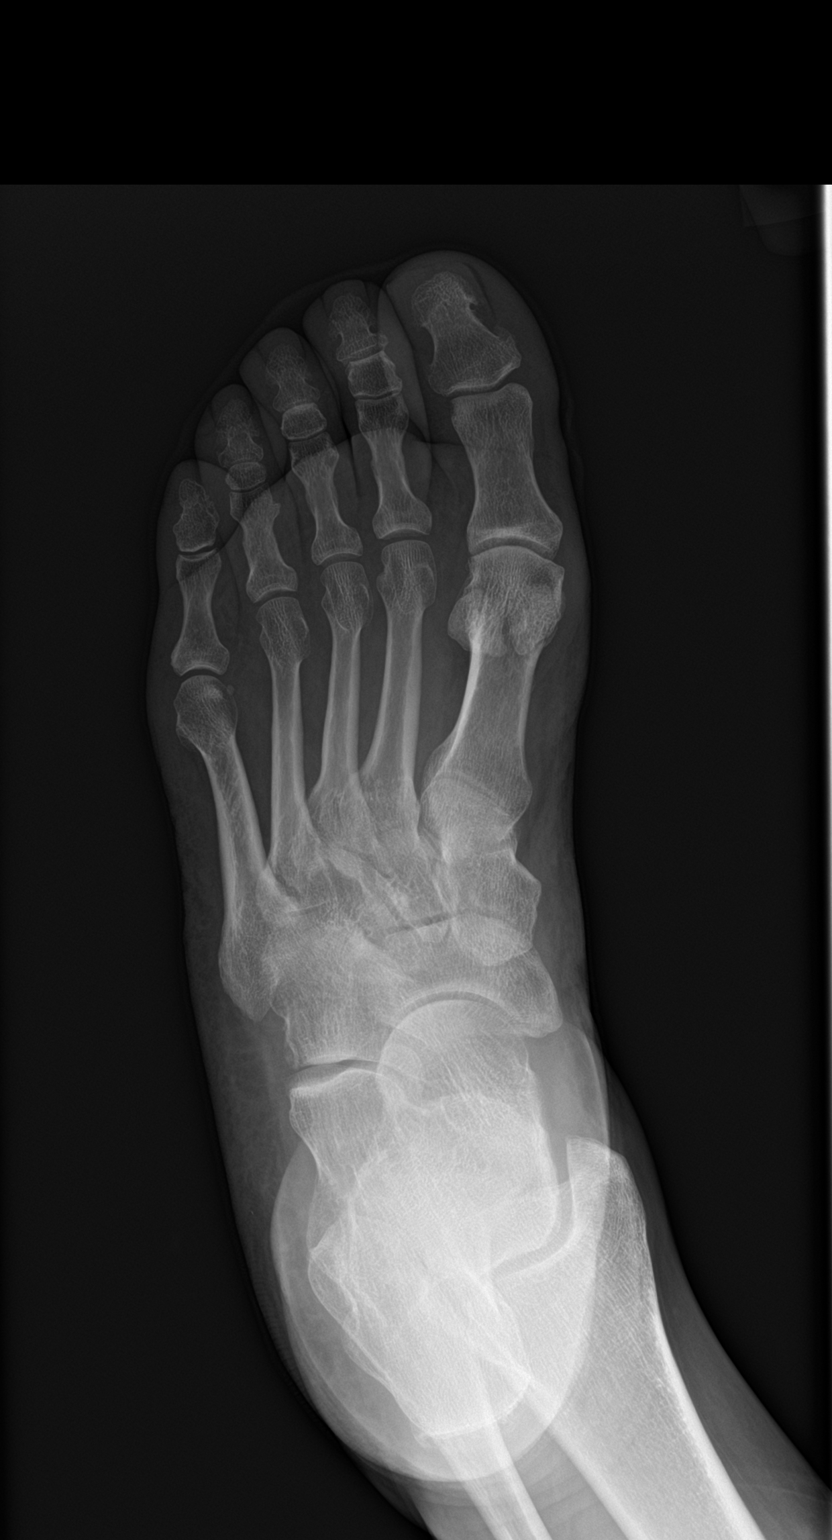

[foot ap]
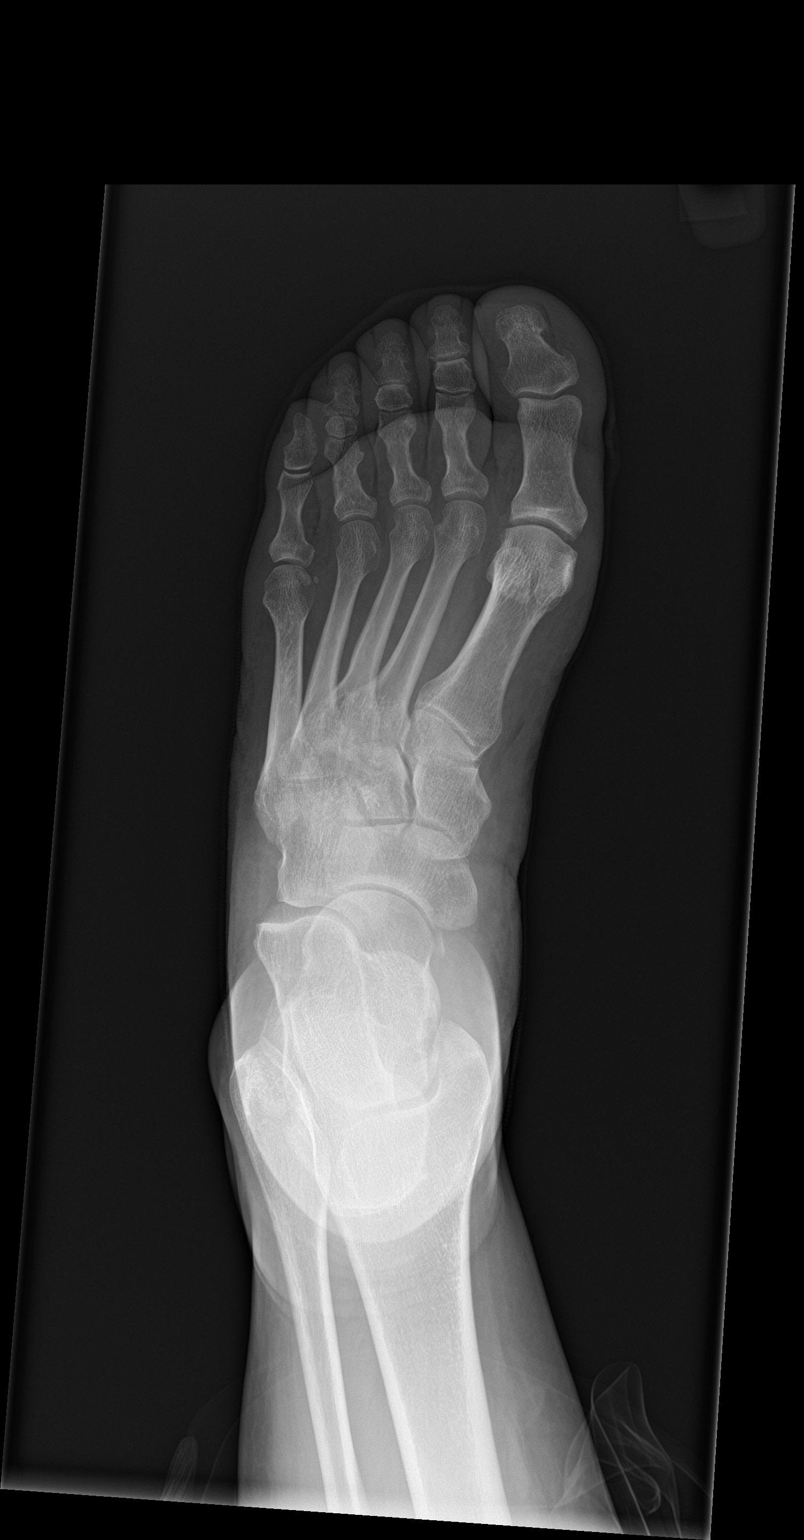

[3 of 3 positions shown; findings below may reference images not displayed]

FINDINGS: There is no evidence of fracture or other focal bone lesions. Soft
tissues are unremarkable.
IMPRESSION: Negative.
